# Patient Record
Sex: Male | Born: 1970 | Race: White | Hispanic: No | Marital: Married | Smoking: Current every day smoker
Health system: Southern US, Community
[De-identification: ages and names within clinical notes are randomized; demographics above are authoritative.]

## PROBLEM LIST (undated history)

## (undated) DIAGNOSIS — K589 Irritable bowel syndrome without diarrhea: Secondary | ICD-10-CM

## (undated) HISTORY — PX: CHOLECYSTECTOMY: SHX55

---

## 2008-12-20 ENCOUNTER — Ambulatory Visit: Payer: Self-pay | Admitting: Family Medicine

## 2008-12-20 DIAGNOSIS — F411 Generalized anxiety disorder: Secondary | ICD-10-CM | POA: Insufficient documentation

## 2008-12-20 DIAGNOSIS — R109 Unspecified abdominal pain: Secondary | ICD-10-CM | POA: Insufficient documentation

## 2008-12-20 DIAGNOSIS — F329 Major depressive disorder, single episode, unspecified: Secondary | ICD-10-CM

## 2008-12-20 DIAGNOSIS — K5732 Diverticulitis of large intestine without perforation or abscess without bleeding: Secondary | ICD-10-CM

## 2008-12-29 ENCOUNTER — Emergency Department (HOSPITAL_BASED_OUTPATIENT_CLINIC_OR_DEPARTMENT_OTHER): Admission: EM | Admit: 2008-12-29 | Discharge: 2008-12-29 | Payer: Self-pay | Admitting: Emergency Medicine

## 2008-12-29 ENCOUNTER — Ambulatory Visit: Payer: Self-pay | Admitting: Diagnostic Radiology

## 2008-12-29 ENCOUNTER — Ambulatory Visit: Payer: Self-pay | Admitting: Family Medicine

## 2009-01-01 ENCOUNTER — Ambulatory Visit: Payer: Self-pay | Admitting: Family Medicine

## 2009-01-01 DIAGNOSIS — R740 Nonspecific elevation of levels of transaminase and lactic acid dehydrogenase [LDH]: Secondary | ICD-10-CM

## 2009-01-01 DIAGNOSIS — K8020 Calculus of gallbladder without cholecystitis without obstruction: Secondary | ICD-10-CM | POA: Insufficient documentation

## 2009-01-02 ENCOUNTER — Encounter: Payer: Self-pay | Admitting: Family Medicine

## 2009-01-04 ENCOUNTER — Encounter: Payer: Self-pay | Admitting: Family Medicine

## 2009-01-12 ENCOUNTER — Encounter: Admission: RE | Admit: 2009-01-12 | Discharge: 2009-01-12 | Payer: Self-pay | Admitting: Unknown Physician Specialty

## 2009-03-08 ENCOUNTER — Encounter (INDEPENDENT_AMBULATORY_CARE_PROVIDER_SITE_OTHER): Payer: Self-pay | Admitting: General Surgery

## 2009-03-08 ENCOUNTER — Ambulatory Visit (HOSPITAL_COMMUNITY): Admission: RE | Admit: 2009-03-08 | Discharge: 2009-03-10 | Payer: Self-pay | Admitting: General Surgery

## 2009-03-12 ENCOUNTER — Ambulatory Visit: Payer: Self-pay | Admitting: Family Medicine

## 2009-03-12 DIAGNOSIS — L259 Unspecified contact dermatitis, unspecified cause: Secondary | ICD-10-CM | POA: Insufficient documentation

## 2009-03-21 ENCOUNTER — Emergency Department (HOSPITAL_COMMUNITY): Admission: EM | Admit: 2009-03-21 | Discharge: 2009-03-21 | Payer: Self-pay | Admitting: Emergency Medicine

## 2009-03-21 ENCOUNTER — Ambulatory Visit: Payer: Self-pay | Admitting: Family Medicine

## 2009-03-21 DIAGNOSIS — R3915 Urgency of urination: Secondary | ICD-10-CM | POA: Insufficient documentation

## 2009-03-21 DIAGNOSIS — K921 Melena: Secondary | ICD-10-CM

## 2009-03-21 LAB — CONVERTED CEMR LAB
Bilirubin Urine: NEGATIVE
Blood in Urine, dipstick: NEGATIVE
Specific Gravity, Urine: 1.015
WBC Urine, dipstick: NEGATIVE

## 2009-03-22 ENCOUNTER — Encounter: Payer: Self-pay | Admitting: Family Medicine

## 2009-04-05 ENCOUNTER — Encounter: Payer: Self-pay | Admitting: Family Medicine

## 2009-08-24 ENCOUNTER — Encounter: Payer: Self-pay | Admitting: Family Medicine

## 2009-10-02 ENCOUNTER — Encounter: Payer: Self-pay | Admitting: Family Medicine

## 2010-02-25 ENCOUNTER — Telehealth: Payer: Self-pay | Admitting: Family Medicine

## 2010-03-19 ENCOUNTER — Ambulatory Visit
Admission: RE | Admit: 2010-03-19 | Discharge: 2010-03-19 | Payer: Self-pay | Source: Home / Self Care | Attending: Family Medicine | Admitting: Family Medicine

## 2010-03-20 ENCOUNTER — Encounter: Payer: Self-pay | Admitting: Family Medicine

## 2010-03-20 LAB — CONVERTED CEMR LAB
ALT: 27 units/L (ref 0–53)
Alkaline Phosphatase: 72 units/L (ref 39–117)
Amylase: 31 units/L (ref 0–105)
BUN: 8 mg/dL (ref 6–23)
Calcium: 9.6 mg/dL (ref 8.4–10.5)
Chloride: 102 meq/L (ref 96–112)
Eosinophils Relative: 2 % (ref 0–5)
Glucose, Bld: 78 mg/dL (ref 70–99)
Lymphs Abs: 2.6 10*3/uL (ref 0.7–4.0)
MCHC: 34.3 g/dL (ref 30.0–36.0)
Monocytes Absolute: 0.6 10*3/uL (ref 0.1–1.0)
Monocytes Relative: 6 % (ref 3–12)
Platelets: 264 10*3/uL (ref 150–400)
RBC: 4.99 M/uL (ref 4.22–5.81)
Total Protein: 7.1 g/dL (ref 6.0–8.3)
WBC: 11.2 10*3/uL — ABNORMAL HIGH (ref 4.0–10.5)

## 2010-04-09 NOTE — Assessment & Plan Note (Signed)
Summary: Rash and Melena   Vital Signs:  Patient profile:   40 year old male Height:      71 inches Weight:      281 pounds Temp:     98.1 degrees F oral Pulse rate:   74 / minute BP sitting:   127 / 75  (left arm) Cuff size:   large  Vitals Entered By: [name] (March 21, 2009 2:41 PM) CC: rash still present. Having black tarry stools and leaking from rectum constantly. Feels terrible   Primary Care Provider:  [name]  CC:  rash still present. Having black tarry stools and leaking from rectum constantly. Feels terrible.  History of Present Illness: rash still present. Had cholecystectomy on 12/30 and started having rash before hospital discharge. He was given prednisone for the rash and has not responded. Completed teh steroid 4 days ago. Says the rash is really itchy still. Now notices some mild edema around the ankles bilaterally.   Having black tarry stools and leaking from rectum constantly. Feels terrible.  Also having some urinary frequency.  No hematuria.  No control over his BMs. Now has a hemorrhoid and stools look black and tarry in "globs". Doesn't matter what he eats will have a BM after 20 minutes.  Has been trying to use OTC suppositoreis for the hemorrhoid but they stimulate a BM and come right out. Feels like constantly has to urinate, started last night.  Feels like having sweats for about the past 5 days but feels like his skin is cold to touch. Not on any NSAIDs right now.   GI - Dr. Marney Setting [facility]) Gen Surgery - Dr. Derrell Lolling ([facility])  Current Medications (verified): 1)  None  Allergies (verified): 1)  ! * Opiates  Comments:  Nurse/Medical Assistant: The patient's medications and allergies were reviewed with the patient and were updated in the Medication and Allergy Lists. [name] (March 21, 2009 2:42 PM)  Physical Exam  General:  Well-developed,well-nourished,in no acute distress; alert,appropriate and cooperative throughout  examination. appears fatigued.  Skin:  diffuse erythematous fine papules on the abdomen, low back and lower extremities.   Psych:  Cognition and judgment appear intact. Alert and cooperative with normal attention span and concentration. No apparent delusions, illusions, hallucinations   Impression & Recommendations:  Problem # 1:  MELENA (ICD-578.1) Assessment New With his recent surgery I am worried about a GI bleed either post surgery or possibly from teh steroid. He has also had a persistant rash that is concerning as well as sweats though he is afebrile here. I am also concerned about possible post surgical infection.  He is really uncomfortable here in the office today.  We called [facility] and my nurse spoke with Dr. Aura Camps nurse. They recommened he go to [facility] ED immediately.  LIkely will need abdominal CT and stat labs.   30 min spent with the patient and coordinating care.    Problem # 2:  URINARY URGENCY (EAV-409.81) Assessment: New  UA is clear today so may be more related to his pelvic sxs.   Orders: UA Dipstick w/o Micro (automated)  (81003)  Problem # 3:  SKIN RASH, ALLERGIC (ICD-692.9) He clearly didn't respond  to the prednisone and his rash is still present. I am worried this may be related to an infection or some inflammatory proces in his body.   The following medications were removed from the medication list:    Prednisone 10 Mg Tabs (Prednisone) .Marland Kitchen... Take  6 tabs a day for day1, 5 tabs day 2, 4 tabs day 3, 3 tabs day 4, 2 tabs day 5, 1 tab on day 6.  Laboratory Results   Urine Tests  Date/Time Received: 03/21/2009 Date/Time Reported: 03/21/2009  Routine Urinalysis   Color: yellow Appearance: Clear Glucose: negative   (Normal Range: Negative) Bilirubin: negative   (Normal Range: Negative) Ketone: negative   (Normal Range: Negative) Spec. Gravity: 1.015   (Normal Range: 1.003-1.035) Blood: negative   (Normal Range: Negative) pH: 6.0   (Normal Range: 5.0-8.0) Protein:  negative   (Normal Range: Negative) Urobilinogen: 0.2   (Normal Range: 0-1) Nitrite: negative   (Normal Range: Negative) Leukocyte Esterace: negative   (Normal Range: Negative)       Prevention & Chronic Care Immunizations   Influenza vaccine: given  (03/09/2009)   Influenza vaccine due: 03/09/2010    Tetanus booster: 03/09/2009: given   Tetanus booster due: 03/10/2019    Pneumococcal vaccine: Not documented  Other Screening   Smoking status: Not documented  Lipids   Total Cholesterol: Not documented   LDL: Not documented   LDL Direct: Not documented   HDL: Not documented   Triglycerides: Not documented

## 2010-04-09 NOTE — Assessment & Plan Note (Signed)
Summary: Rash   Vital Signs:  Patient profile:   40 year old male Height:      71 inches Weight:      277 pounds Pulse rate:   77 / minute BP sitting:   113 / 70  (left arm) Cuff size:   large  Vitals Entered By: [name] (March 12, 2009 2:40 PM) CC: rash on legs, arms, back stomach had since discharged from hospital from gallbladder surgery- itches. Last Percocet was 3 days ago- using Benedryl   Primary Care Provider:  [name]  CC:  rash on legs, arms, and back stomach had since discharged from hospital from gallbladder surgery- itches. Last Percocet was 3 days ago- using Benedryl.  History of Present Illness: rash on legs, arms, back stomach had since discharged from hospital from gallbladder surgery- itches. Last Percocet was 3 days ago- using Benedryl.  Rash started while in the hospitalized.  Was told it may be reaction to the compression  hose.  Benadryl does help stop the itching.  Also has stopped his percocet.  Was on morphine in the hospital. NOt d/c home on any new medications. Rash started on his legs. Had flu and PNA shot on the 30th and rash started next day. no SOB.  REviewd post op note.   Current Medications (verified): 1)  None  Allergies (verified): 1)  ! * Opiates  Comments:  Nurse/Medical Assistant: The patient's medications and allergies were reviewed with the patient and were updated in the Medication and Allergy Lists. [name] (March 12, 2009 2:41 PM)  Physical Exam  General:  Well-developed,well-nourished,in no acute distress; alert,appropriate and cooperative throughout examination Abdomen:  Sugrical scars healing well. No drainage or erythema.  Skin:  Erythematous fine papules on his LE, abdomen and back.   Psych:  Cognition and judgment appear intact. Alert and cooperative with normal attention span and concentration. No apparent delusions, illusions, hallucinations   Impression & Recommendations:  Problem # 1:  SKIN  RASH, ALLERGIC (ICD-692.9)  Will treat with oral steroids. Warned of potential Side effects. Likely an allergic reaction to medication or anesthesia. He is no longer being exposed so hopefully will improve in one week.  If not better in one week or comes back then please let me know.   His updated medication list for this problem includes:    Prednisone 10 Mg Tabs (Prednisone) .Marland Kitchen... Take 6 tabs a day for day1, 5 tabs day 2, 4 tabs day 3, 3 tabs day 4, 2 tabs day 5, 1 tab on day 6.  Complete Medication List: 1)  Prednisone 10 Mg Tabs (Prednisone) .... Take 6 tabs a day for day1, 5 tabs day 2, 4 tabs day 3, 3 tabs day 4, 2 tabs day 5, 1 tab on day 6. Prescriptions: PREDNISONE 10 MG TABS (PREDNISONE) Take 6 tabs a day for Day1, 5 tabs Day 2, 4 tabs Day 3, 3 tabs Day 4, 2 tabs Day 5, 1 tab on Day 6.  #210 x 0   Entered and Authorized by:   [name]   Signed by:   [name] on 03/12/2009   Method used:   Electronically to        [facility] [phone]* (retail)       [address] [place], [place]  [address]       Ph: [phone]       Fax: [phone]  RxID:   [id number]   Flu Vaccine Result Date:  03/09/2009 Flu Vaccine Result:  given Flu Vaccine Next Due:  1 yr TD Result Date:  03/09/2009 TD Result:  given TD Next Due:  10 yr

## 2010-04-09 NOTE — Letter (Signed)
Summary: [facility]   Imported By: Lanelle Bal 03/29/2009 10:59:45  _____________________________________________________________________  External Attachment:    Type:   Image     Comment:   External Document

## 2010-04-09 NOTE — Letter (Signed)
Summary: [facility]   Imported By: Lanelle Bal 09/06/2009 08:24:37  _____________________________________________________________________  External Attachment:    Type:   Image     Comment:   External Document

## 2010-04-09 NOTE — Letter (Signed)
Summary: [facility]  [facility]   Imported By: Lanelle Bal 04/20/2009 11:33:57  _____________________________________________________________________  External Attachment:    Type:   Image     Comment:   External Document

## 2010-04-11 NOTE — Assessment & Plan Note (Signed)
Summary: Abdominal Pain   Vital Signs:  Patient profile:   40 year old male Height:      71 inches Weight:      267.50 pounds BMI:     37.44 O2 Sat:      98 % on Room air Temp:     98.2 degrees F oral Pulse rate:   72 / minute Pulse rhythm:   regular Resp:     18 per minute BP sitting:   117 / 78  (right arm) Cuff size:   large  Vitals Entered By: Mervin Kung CMA Duncan Dull) (March 19, 2010 2:47 PM)  O2 Flow:  Room air CC: Pt state he is having abdominal pain x 1 1/2 years. Is Patient Diabetic? No Comments Pt states he is smoking pot for the pain. Mervin Kung CMA Duncan Dull)  March 19, 2010 2:53 PM    Primary Care Provider:  [name]  CC:  Pt state he is having abdominal pain x 1 1/2 years..  History of Present Illness: Say the dicyclomine makes his bladder numb and affects urination. Also feels the medication is not really helping him. Abodminal pain is moslty on the left and goes around the right side near his incision.  Still feels very bloated.  But not alot of gas.  Occ nauseated. oNly eats once a day. Says stomach starts hurting soon after he eats. Has diarrhea about 1 hour after he eats. Feel he is getting much worse. Occ will have 2-3 good days but now feels worse. Says his stools are mostly mucously but hasn't seen blood in awhile.   Allergies: 1)  ! * Opiates  Family History: Reviewed history from 12/20/2008 and no changes required. Mother, Graves disease Father, Heart disease  Social History: Reviewed history from 12/20/2008 and no changes required. 1 ppd smoker, 26 yrs ETOH-yes No DRugs Heating and Air  Physical Exam  General:  Well-developed,well-nourished,in no acute distress; alert,appropriate and cooperative throughout examination Lungs:  Normal respiratory effort, chest expands symmetrically. Lungs are clear to auscultation, no crackles or wheezes. Heart:  Normal rate and regular rhythm. S1 and S2 normal without gallop, murmur,  click, rub or other extra sounds. Abdomen:  Very tender in the LUQ.  Normal BS. No masses. No organomegaly.  Skin:  no rashes.   Psych:  Cognition and judgment appear intact. Alert and cooperative with normal attention span and concentration. No apparent delusions, illusions, hallucinations   Impression & Recommendations:  Problem # 1:  ABDOMINAL PAIN (ICD-789.00) Unclear etiology. He has had extensive w/u in the past and has appt in 6 weeks with GI in [facility] him samples of dexilant to try. He can stop the dicyclomine if not really helping.  Will rule out pancreatitis or acute infection since he feels his sxs are worse.  Orders: T-Comprehensive Metabolic Panel [id number]) T-CBC w/Diff [id number]) T-Amylase [id number]) T-Lipase [id number]) T-TSH [id number])  Complete Medication List: 1)  Dicyclomine Hcl 20 Mg Tabs (Dicyclomine hcl) .... Take 1 tablet by mouth up to four times a day as needed for stomach pain.  Patient Instructions: 1)  Try the dexilant once a day about 15 min before your meal.   2)  Keep GI appointment in February   Orders Added: 1)  T-Comprehensive Metabolic Panel [[id number]] 2)  T-CBC w/Diff [[id number]] 3)  T-Amylase [[id number]] 4)  T-Lipase [[id number]] 5)  T-TSH [[id number]] 6)  Est. Patient Level IV [38756]    Current Allergies (reviewed today): ! * OPIATES

## 2010-04-11 NOTE — Progress Notes (Signed)
Summary: referral to GI and pain no better  Phone Note Call from Patient Call back at [phone]   Caller: Patient Call For: [name] Summary of Call: Would like a referral to Dr. Karena Addison located at [facility] GI- GI he is seeing he is not satisfied with what he is telling them. Pt states alot of abdominal pain and bloating and wants to know what else he can take for the pain- not taking anything for the pain Initial call taken by: [name] LPN,  February 25, 2010 11:00 AM  Follow-up for Phone Call        I would avoid any dairy or high fat foods, scine no longer has his GB.  We can make new referral. We can try dicyclomine for discomfort until get in with GI.  Follow-up by: [name],  February 25, 2010 11:20 AM  Additional Follow-up for Phone Call Additional follow up Details #1::        Wife notified of above and that med was sent to [facility]. Wife requested that med be sent to [facility] so resent to them. KJ LPN Additional Follow-up by: [name] LPN,  February 25, 2010 11:28 AM    New/Updated Medications: DICYCLOMINE HCL 20 MG TABS (DICYCLOMINE HCL) Take 1 tablet by mouth up to four times a day as needed for stomach pain. Prescriptions: DICYCLOMINE HCL 20 MG TABS (DICYCLOMINE HCL) Take 1 tablet by mouth up to four times a day as needed for stomach pain.  #90 x 0   Entered by:   [name] LPN   Authorized by:   [name]   Signed by:   [name] LPN on 20/25/4270   Method used:   Electronically to        [facility]* (retail)       [address]       [place], [place]  [address]       Ph: [phone]       Fax: [phone]   RxID:   [id number] DICYCLOMINE HCL 20 MG TABS (DICYCLOMINE HCL) Take 1 tablet by mouth up to four times a day as needed for stomach pain.  #90 x 0   Entered and Authorized by:   [name]   Signed by:   [name] on 02/25/2010   Method used:    Electronically to        [facility] [phone]* (retail)       [address] [place], [place]  [address]       Ph: [phone]       Fax: [phone]   RxID:   [id number]   Appended Document: referral to GI and pain no better

## 2010-05-06 ENCOUNTER — Encounter: Payer: Self-pay | Admitting: Family Medicine

## 2010-05-26 LAB — CBC
HCT: 43.9 % (ref 39.0–52.0)
MCV: 92.6 fL (ref 78.0–100.0)

## 2010-05-26 LAB — DIFFERENTIAL
Lymphocytes Relative: 31 % (ref 12–46)
Lymphs Abs: 2.6 10*3/uL (ref 0.7–4.0)
Monocytes Relative: 7 % (ref 3–12)
Neutro Abs: 4.8 10*3/uL (ref 1.7–7.7)
Neutrophils Relative %: 56 % (ref 43–77)

## 2010-05-26 LAB — COMPREHENSIVE METABOLIC PANEL
AST: 30 U/L (ref 0–37)
Albumin: 4.1 g/dL (ref 3.5–5.2)
BUN: 10 mg/dL (ref 6–23)
Calcium: 9.4 mg/dL (ref 8.4–10.5)
GFR calc non Af Amer: >60 mL/min (ref >60)
Potassium: 4.3 mEq/L (ref 3.5–5.1)
Sodium: 139 mEq/L (ref 135–145)
Total Bilirubin: 0.6 mg/dL (ref 0.3–1.2)
Total Protein: 7 g/dL (ref 6.0–8.3)

## 2010-05-26 LAB — URINE CULTURE: Culture: NO GROWTH

## 2010-05-26 LAB — URINALYSIS, ROUTINE W REFLEX MICROSCOPIC: Urobilinogen, UA: 0.2 mg/dL (ref 0.0–1.0)

## 2010-05-26 LAB — LIPASE, BLOOD: Lipase: 25 U/L (ref 11–59)

## 2010-05-28 NOTE — Consult Note (Signed)
[facility]   Imported By: Maryln Gottron 05/21/2010 10:03:06  _____________________________________________________________________  External Attachment:    Type:   Image     Comment:   External Document

## 2010-06-10 LAB — COMPREHENSIVE METABOLIC PANEL
ALT: 99 U/L — ABNORMAL HIGH (ref 0–53)
AST: 71 U/L — ABNORMAL HIGH (ref 0–37)
Albumin: 4 g/dL (ref 3.5–5.2)
Alkaline Phosphatase: 69 U/L (ref 39–117)
BUN: 13 mg/dL (ref 6–23)
CO2: 27 mEq/L (ref 19–32)
Calcium: 9.4 mg/dL (ref 8.4–10.5)
Chloride: 105 mEq/L (ref 96–112)
Creatinine, Ser: 0.9 mg/dL (ref 0.4–1.5)
GFR calc Af Amer: >60 mL/min (ref >60)
GFR calc non Af Amer: >60 mL/min (ref >60)
Glucose, Bld: 106 mg/dL — ABNORMAL HIGH (ref 70–99)
Potassium: 4.4 mEq/L (ref 3.5–5.1)
Sodium: 138 mEq/L (ref 135–145)
Total Bilirubin: 0.3 mg/dL (ref 0.3–1.2)
Total Protein: 7.1 g/dL (ref 6.0–8.3)

## 2010-06-10 LAB — DIFFERENTIAL
Basophils Absolute: 0 10*3/uL (ref 0.0–0.1)
Basophils Relative: 0 % (ref 0–1)
Eosinophils Relative: 6 % — ABNORMAL HIGH (ref 0–5)
Lymphocytes Relative: 30 % (ref 12–46)
Neutro Abs: 4 10*3/uL (ref 1.7–7.7)

## 2010-06-10 LAB — CBC
MCHC: 34.2 g/dL (ref 30.0–36.0)
MCV: 93.1 fL (ref 78.0–100.0)
Platelets: 264 10*3/uL (ref 150–400)
WBC: 7.2 10*3/uL (ref 4.0–10.5)

## 2010-06-10 LAB — URINALYSIS, ROUTINE W REFLEX MICROSCOPIC
Hgb urine dipstick: NEGATIVE
Ketones, ur: NEGATIVE mg/dL
Nitrite: NEGATIVE
Protein, ur: NEGATIVE mg/dL

## 2010-06-13 LAB — DIFFERENTIAL
Basophils Absolute: 0.1 10*3/uL (ref 0.0–0.1)
Eosinophils Relative: 3 % (ref 0–5)
Lymphocytes Relative: 34 % (ref 12–46)
Lymphs Abs: 2.9 10*3/uL (ref 0.7–4.0)
Monocytes Relative: 7 % (ref 3–12)
Neutrophils Relative %: 55 % (ref 43–77)

## 2010-06-13 LAB — CBC
Hemoglobin: 15.8 g/dL (ref 13.0–17.0)
MCHC: 35.1 g/dL (ref 30.0–36.0)
MCV: 92.1 fL (ref 78.0–100.0)
Platelets: 247 10*3/uL (ref 150–400)
RDW: 12.1 % (ref 11.5–15.5)

## 2010-06-13 LAB — URINALYSIS, ROUTINE W REFLEX MICROSCOPIC: Specific Gravity, Urine: 1.017 (ref 1.005–1.030)

## 2010-06-13 LAB — COMPREHENSIVE METABOLIC PANEL
Alkaline Phosphatase: 73 U/L (ref 39–117)
Calcium: 9.7 mg/dL (ref 8.4–10.5)
Creatinine, Ser: 1 mg/dL (ref 0.4–1.5)
GFR calc Af Amer: >60 mL/min (ref >60)
Glucose, Bld: 83 mg/dL (ref 70–99)
Sodium: 141 mEq/L (ref 135–145)
Total Protein: 7.2 g/dL (ref 6.0–8.3)

## 2010-06-13 LAB — LIPASE, BLOOD: Lipase: 108 U/L (ref 23–300)

## 2011-03-27 ENCOUNTER — Other Ambulatory Visit: Payer: Self-pay | Admitting: Family Medicine

## 2011-03-27 MED ORDER — TAMSULOSIN HCL 0.4 MG PO CAPS: 0.4000 mg | ORAL_CAPSULE | Freq: Every day | ORAL | Status: DC

## 2012-12-15 ENCOUNTER — Encounter: Payer: Self-pay | Admitting: Emergency Medicine

## 2012-12-15 ENCOUNTER — Emergency Department
Admission: EM | Admit: 2012-12-15 | Discharge: 2012-12-15 | Disposition: A | Discharge status: Home / Self Care | Payer: BC Managed Care – PPO | Source: Home / Self Care

## 2012-12-15 DIAGNOSIS — R112 Nausea with vomiting, unspecified: Secondary | ICD-10-CM

## 2012-12-15 DIAGNOSIS — R509 Fever, unspecified: Secondary | ICD-10-CM

## 2012-12-15 HISTORY — DX: Irritable bowel syndrome, unspecified: K58.9

## 2012-12-15 LAB — POCT CBC W AUTO DIFF (K'VILLE URGENT CARE)

## 2012-12-15 LAB — POCT URINALYSIS DIP (MANUAL ENTRY)
Bilirubin, UA: NEGATIVE
Glucose, UA: NEGATIVE
Ketones, POC UA: NEGATIVE
Leukocytes, UA: NEGATIVE
Nitrite, UA: NEGATIVE
Protein Ur, POC: NEGATIVE
Spec Grav, UA: 1.01 (ref 1.005–1.03)

## 2012-12-15 LAB — POCT INFLUENZA A/B
Influenza A, POC: NEGATIVE
Influenza B, POC: NEGATIVE

## 2012-12-15 NOTE — ED Notes (Signed)
Ronald Hester c/o fever, chills, vomiting, body aches, HA x yesterday. Diarrhea started today. Last dose of IBF 3 hours ago. No flu vaccine this season.

## 2012-12-15 NOTE — ED Provider Notes (Signed)
CSN: [id number]     Arrival date & time 12/15/12  1341 History   None    Chief Complaint  Patient presents with  . Emesis  . Generalized Body Aches      HPI Comments: Yesterday evening about 6PM patient developed myalgias, fatigue, chills/sweats, and headache.  He had several episodes of vomiting without nausea.  He has had several episodes of diarrhea but notes that diarrhea is normal for him because he has IBS.  He has also had stomach pain, which he states is also usual for him.  No hematochezia.  He noted increased urine frequency without dysuria.  No respiratory symptoms.  He complains of some vague lower back discomfort. He has not travelled overseas recently, and has not been exposed to anyone recently who has travelled overseas.  The history is provided by the patient.    Past Medical History  Diagnosis Date  . IBS (irritable bowel syndrome)    Past Surgical History  Procedure Laterality Date  . Cholecystectomy     History reviewed. No pertinent family history. History  Substance Use Topics  . Smoking status: Current Every Day Smoker -- 1.00 packs/day for 30 years    Types: Cigarettes  . Smokeless tobacco: Current User     Comment: vape  . Alcohol Use: No    Review of Systems No sore throat No cough No pleuritic pain No wheezing No nasal congestion No post-nasal drainage No sinus pain/pressure No itchy/red eyes No earache No hemoptysis No SOB + fever, + chills No nausea + vomiting + abdominal pain + low back ache + diarrhea; no hematochezia Increased urine frequency without dysuria No skin rashes + fatigue + myalgias + headache    Allergies  Codeine; Fructose; and Lactose intolerance (gi)  Home Medications  No current outpatient prescriptions on file. BP 107/71  Pulse 71  Temp(Src) 98.1 F (36.7 C) (Oral)  Resp 16  Ht 5\' 11"  (1.803 m)  Wt 239 lb (108.41 kg)  BMI 33.35 kg/m2  SpO2 98% Physical Exam Nursing notes and Vital Signs  reviewed. Appearance:  Patient appears stated age, and in no acute distress.  Patient is obese (BMI 33.4) Eyes:  Pupils are equal, round, and reactive to light and accomodation.  Extraocular movement is intact.  Conjunctivae are not inflamed  Ears:  Canals normal.  Tympanic membranes normal.  Nose:  Mildly congested turbinates.  No sinus tenderness.   Mouth:  No lesions; moist mucous membranes   Pharynx:  Normal Neck:  Supple.   Tender shotty posterior nodes are palpated bilaterally  Lungs:  Clear to auscultation.  Breath sounds are equal.  Heart:  Regular rate and rhythm without murmurs, rubs, or gallops.  Abdomen:   Tenderness over spleen without hepatosplenomegaly. Tenderness left lower quadrant without masses..  Bowel sounds are present.  No CVA or flank tenderness.  Extremities:  No edema.  No calf tenderness Skin:  No rash present.   Back:  No tenderness to palpation. ED Course  Procedures  none    Labs Reviewed  EPSTEIN-BARR VIRUS VCA, IGM  EPSTEIN-BARR VIRUS VCA, IGG  EPSTEIN-BARR VIRUS NUCLEAR ANTIGEN ANTIBODY, IGG  EPSTEIN-BARR VIRUS EARLY D ANTIGEN ANTIBODY, IGG  POCT URINALYSIS DIP (MANUAL ENTRY) Negative  POCT INFLUENZA A/B negative  POCT CBC W AUTO DIFF ([facility])   WBC 6.7; LY 40.4; MO 12.7; GR 46.9; Hgb 14.6; Platelets 225        MDM   1. Nausea with vomiting   2. Fever,  unspecified; ?mono (note increased monocytes on diff:  12.7)    Check EBV titers Rest.  Continue increased fluid intake.  May take Ibuprofen 200mg , 4 tabs every 8 hours with food for headache, body aches, etc. Followup with Family Doctor if not improved in one week.  If symptoms become significantly worse during the night or over the weekend, proceed to the local emergency room.     Lattie Haw, MD 12/15/12 [phone]

## 2012-12-16 LAB — EPSTEIN-BARR VIRUS EARLY D ANTIGEN ANTIBODY, IGG: EBV EA IgG: 5.2 U/mL (ref <9.0)

## 2012-12-16 LAB — EPSTEIN-BARR VIRUS NUCLEAR ANTIGEN ANTIBODY, IGG: EBV NA IgG: 328 U/mL — ABNORMAL HIGH (ref <18.0)

## 2012-12-16 LAB — EPSTEIN-BARR VIRUS VCA, IGM: EBV VCA IgM: 23.2 U/mL (ref <36.0)

## 2012-12-16 LAB — EPSTEIN-BARR VIRUS VCA, IGG: EBV VCA IgG: 495 U/mL — ABNORMAL HIGH (ref <18.0)

## 2012-12-17 ENCOUNTER — Telehealth: Payer: Self-pay | Admitting: *Deleted

## 2013-05-16 ENCOUNTER — Encounter: Payer: Self-pay | Admitting: Family Medicine

## 2013-05-16 ENCOUNTER — Ambulatory Visit (INDEPENDENT_AMBULATORY_CARE_PROVIDER_SITE_OTHER): Payer: BC Managed Care – PPO | Admitting: Family Medicine

## 2013-05-16 VITALS — BP 104/60 | HR 69 | Temp 97.0°F | Ht 71.0 in | Wt 221.0 lb

## 2013-05-16 DIAGNOSIS — K589 Irritable bowel syndrome without diarrhea: Secondary | ICD-10-CM | POA: Insufficient documentation

## 2013-05-16 DIAGNOSIS — F411 Generalized anxiety disorder: Secondary | ICD-10-CM

## 2013-05-16 MED ORDER — HYOSCYAMINE SULFATE ER 0.375 MG PO TB12
0.3750 mg | ORAL_TABLET | Freq: Two times a day (BID) | ORAL | Status: DC
Start: 2013-05-16 — End: 2013-07-28

## 2013-05-16 MED ORDER — AMITRIPTYLINE HCL 50 MG PO TABS: 25.0000 mg | ORAL_TABLET | Freq: Every day | ORAL | Status: DC

## 2013-05-16 NOTE — Progress Notes (Addendum)
Subjective:    Patient ID: Ronald Hester, male    DOB: 04/21/1970, 43 y.o.   MRN: [id number]  HPI Hx of IBS.  Says used to be on anti-depressants and that helped his IBS but didn't like how he felt on it. Lactose seems to be a trigger for him as well.  Fructose as well.  Does have a hx of Anxiety.  Also has a IGA deficiency.  His spasms improved greatly after cutting out soda.  Thinks ate something with milk in it last night.   Gets a lot of pelvic pain with his IBS and has had a Urology workup in the past. Negative for celiac. He does mediate. His cramping is so severe at times that doubles him over. His wife wants to know at what point patient taken to the emergency room. He has occasionally vomited with this pain but this is not common. He is working with Dr. Loreta AveMann, gastroneurology. The she felt more comfortable with the PCP prescribing anxiolytic-type medications.   Review of Systems  BP 104/60  Pulse 69  Temp(Src) 97 F (36.1 C)  Ht 5\' 11"  (1.803 m)  Wt 221 lb (100.245 kg)  BMI 30.84 kg/m2  SpO2 98%    Allergies  Allergen Reactions  . Codeine   . Fructose   . Lactose Intolerance (Gi)     Past Medical History  Diagnosis Date  . IBS (irritable bowel syndrome)     Past Surgical History  Procedure Laterality Date  . Cholecystectomy      History   Social History  . Marital Status: Married    Spouse Name: N/A    Number of Children: N/A  . Years of Education: N/A   Occupational History  . Not on file.   Social History Main Topics  . Smoking status: Current Every Day Smoker -- 1.00 packs/day for 30 years    Types: Cigarettes  . Smokeless tobacco: Current User     Comment: vape  . Alcohol Use: No  . Drug Use: Yes    Special: Marijuana  . Sexual Activity: Not on file   Other Topics Concern  . Not on file   Social History Narrative  . No narrative on file    No family history on file.  Outpatient Encounter Prescriptions as of 05/16/2013  Medication Sig   . Tamsulosin HCl (FLOMAX) 0.4 MG CAPS Take 1 capsule (0.4 mg total) by mouth daily.  Marland Kitchen. amitriptyline (ELAVIL) 50 MG tablet Take 0.5-1 tablets (25-50 mg total) by mouth at bedtime.  . hyoscyamine (LEVBID) 0.375 MG 12 hr tablet Take 1 tablet (0.375 mg total) by mouth 2 (two) times daily.          Objective:   Physical Exam  Constitutional: He is oriented to person, place, and time. He appears well-developed and well-nourished.  HENT:  Head: Normocephalic and atraumatic.  Cardiovascular: Normal rate, regular rhythm and normal heart sounds.   Pulmonary/Chest: Effort normal and breath sounds normal.  Abdominal: Soft. Bowel sounds are normal. He exhibits no distension and no mass. There is tenderness. There is no rebound and no guarding.  Tender to palpation suprapubically and especially in the left lower quadrant  Neurological: He is alert and oriented to person, place, and time.  Skin: Skin is warm and dry.  Psychiatric: He has a normal mood and affect. His behavior is normal.          Assessment & Plan:  IBS - we discussed trying a TCA  which has been shown in studies to be helpful for patients with IBS. Recommend amitriptyline at bedtime.  Discussed potential side effects of the medication. Start with half a tab and increase as tolerated. I would like to see him back in 4-6 weeks to make sure he is tolerating it well and to adjust his dose as needed. We could certainly consider other options as well if this is not helpful. Also recommend a trial of hyoscyamine, Levbid twice a day to see if this helps with the spasms of the small bowel. He had tried it previously but at the time was still eating a lot of foods that were major triggers for him. Now he is on a better diet and bowel regimen he would like to retry it. I think this is absolutely reasonable and hopefully will be helpful for him. I do think we need to get him back to quality of life so he can get back to work. He is currently  unemployed.  Anxiety-we discussed that this is directly a trigger for his irritable bowel syndrome. Consider SSRI if not respondong to the amitriptyline. I think these issues are definitely directly related to really focus on the IBS right now and then address the anxiety is not responding to treatment for the IBS.  Time spent 25 minutes, greater than 50% spent counseling about irritable bowel syndrome and generalized anxiety disorder.

## 2013-05-23 ENCOUNTER — Telehealth: Payer: Self-pay | Admitting: *Deleted

## 2013-05-23 NOTE — Telephone Encounter (Signed)
The dose doesn't come any higher.  Can take again in 8 hours of needed. Don't have to wait a full twelve hours. I'm glad it is helping.

## 2013-05-23 NOTE — Telephone Encounter (Signed)
Pt called and stated the hyoscyamine is working however it wears off after about 8 hours. He would like to know if the mg strength could be increased. Please advise.Loralee PacasBarkley, Lumi Winslett [place]

## 2013-05-24 NOTE — Telephone Encounter (Signed)
lvm informing pt of recommendations. .Ronald Hester  

## 2013-07-28 ENCOUNTER — Encounter: Payer: Self-pay | Admitting: Family Medicine

## 2013-07-28 ENCOUNTER — Ambulatory Visit (INDEPENDENT_AMBULATORY_CARE_PROVIDER_SITE_OTHER): Payer: BC Managed Care – PPO | Admitting: Family Medicine

## 2013-07-28 VITALS — BP 113/70 | HR 79 | Wt 218.0 lb

## 2013-07-28 DIAGNOSIS — F17200 Nicotine dependence, unspecified, uncomplicated: Secondary | ICD-10-CM

## 2013-07-28 DIAGNOSIS — R112 Nausea with vomiting, unspecified: Secondary | ICD-10-CM

## 2013-07-28 DIAGNOSIS — R0789 Other chest pain: Secondary | ICD-10-CM

## 2013-07-28 DIAGNOSIS — D802 Selective deficiency of immunoglobulin A [IgA]: Secondary | ICD-10-CM | POA: Insufficient documentation

## 2013-07-28 MED ORDER — AMITRIPTYLINE HCL 50 MG PO TABS: 25.0000 mg | ORAL_TABLET | Freq: Every day | ORAL | Status: AC

## 2013-07-28 MED ORDER — HYOSCYAMINE SULFATE ER 0.375 MG PO TB12: 0.3750 mg | ORAL_TABLET | Freq: Two times a day (BID) | ORAL | Status: AC

## 2013-07-28 MED ORDER — OMEPRAZOLE 40 MG PO CPDR: 40.0000 mg | DELAYED_RELEASE_CAPSULE | Freq: Every day | ORAL | Status: DC

## 2013-07-28 NOTE — Progress Notes (Signed)
Subjective:    Patient ID: Ronald Hester, male    DOB: [birth year], 43 y.o.   MRN: [id number]  HPI 43 year old white male Last 2 days has woken up at 4AM and vomited. Says doesn't see blood but tastes blood.  Hx of IBS.  No recent dietary changes. History of cholescytectomy. Taking prilosec as needed.  No GERD sxs.  Says away from preservatives or sugars.  Stay away from milk products.  He admits his stress levels have been up recently. No blood in the stool. He does have history of irritable bowel syndrome. He was last seen in 2012 by gastroenterology at [facility] and did have an EGD performed.  He's also been having some left-sided chest pain. It started about 2 or 3 weeks ago. Then coming and going. It seems to happen more often with activity and is better at rest. At first he thought was more musculoskeletal but he says it's not tender when he presses on it. He then went to see a massage therapist and it didn't seem to help. He feels like it's deep in the tissue. And radiates through his chest to his back. No nausea or vomiting with the chest pain. He says it seems to get better when he rests. Known triggers or injury. He is a one pack-a-day smoker.   Review of Systems  BP 113/70  Pulse 79  Wt 218 lb (98.884 kg)    Allergies  Allergen Reactions  . Codeine   . Fructose   . Lactose Intolerance (Gi)     Past Medical History  Diagnosis Date  . IBS (irritable bowel syndrome)     Past Surgical History  Procedure Laterality Date  . Cholecystectomy      History   Social History  . Marital Status: Married    Spouse Name: N/A    Number of Children: N/A  . Years of Education: N/A   Occupational History  . Not on file.   Social History Main Topics  . Smoking status: Current Every Day Smoker -- 1.00 packs/day for 30 years    Types: Cigarettes  . Smokeless tobacco: Current User     Comment: vape  . Alcohol Use: No  . Drug Use: Yes    Special: Marijuana  . Sexual  Activity: Not on file   Other Topics Concern  . Not on file   Social History Narrative  . No narrative on file    No family history on file.  Outpatient Encounter Prescriptions as of 07/28/2013  Medication Sig  . amitriptyline (ELAVIL) 50 MG tablet Take 0.5-1 tablets (25-50 mg total) by mouth at bedtime.  . hyoscyamine (LEVBID) 0.375 MG 12 hr tablet Take 1 tablet (0.375 mg total) by mouth 2 (two) times daily.  . [DISCONTINUED] amitriptyline (ELAVIL) 50 MG tablet Take 0.5-1 tablets (25-50 mg total) by mouth at bedtime.  . [DISCONTINUED] hyoscyamine (LEVBID) 0.375 MG 12 hr tablet Take 1 tablet (0.375 mg total) by mouth 2 (two) times daily.  Marland Kitchen. omeprazole (PRILOSEC) 40 MG capsule Take 1 capsule (40 mg total) by mouth daily.  . [DISCONTINUED] Tamsulosin HCl (FLOMAX) 0.4 MG CAPS Take 1 capsule (0.4 mg total) by mouth daily.          Objective:   Physical Exam  Constitutional: He is oriented to person, place, and time. He appears well-developed and well-nourished.  HENT:  Head: Normocephalic and atraumatic.  Right Ear: External ear normal.  Left Ear: External ear normal.  Nose: Nose normal.  Mouth/Throat: Oropharynx is clear and moist.  TMs and canals are clear.   Eyes: Conjunctivae and EOM are normal. Pupils are equal, round, and reactive to light.  Neck: Neck supple. No thyromegaly present.  Cardiovascular: Normal rate, regular rhythm and normal heart sounds.   Pulmonary/Chest: Effort normal. No respiratory distress. He has no wheezes. He has rales. He exhibits no tenderness.  Abdominal: Soft. Bowel sounds are normal. He exhibits no distension and no mass. There is no tenderness. There is no rebound and no guarding.  Lymphadenopathy:    He has no cervical adenopathy.  Neurological: He is alert and oriented to person, place, and time.  Skin: Skin is warm and dry.  Psychiatric: He has a normal mood and affect. His behavior is normal.          Assessment & Plan:   Vomiting/nausea-most likely secondary to gastritis. We'll start him back on omeprazole 40 mg twice a day for week or 2. This is controlling his symptoms then can decrease back down to once a day. We'll check a CBC to evaluate for anemia and check the white blood cell count to make sure there is no sign of infection. 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 He also has a history of elevated transaminases so will recheck liver enzymes today as well. Also consider could be related to anxiety as a stress levels have been higher recently.  Atypical chest pain-EKG performed today.  Rate of 66 bpm, NSR, no acute changes.  Inverted T wave in lead 3.  We'll also evaluate for pancreatitis since his discomfort is on the left side of the lower chest.  Tobacco abuse-encourage cessation. Also increases risk of gastritis.

## 2013-07-28 NOTE — Patient Instructions (Addendum)
Diet for Gastroesophageal Reflux Disease, Adult Reflux is when stomach acid flows up into the esophagus. The esophagus becomes irritated and sore (inflammation). When reflux happens often and is severe, it is called gastroesophageal reflux disease (GERD). What you eat can help ease any discomfort caused by GERD. FOODS OR DRINKS TO AVOID OR LIMIT  Coffee and black tea, with or without caffeine.  Bubbly (carbonated) drinks with caffeine or energy drinks.  Strong spices, such as pepper, cayenne pepper, curry, or chili powder.  Peppermint or spearmint.  Chocolate.  High-fat foods, such as meats, fried food, oils, butter, or nuts.  Fruits and vegetables that cause discomfort. This includes citrus fruits and tomatoes.  Alcohol. If a certain food or drink irritates your GERD, avoid eating or drinking it. THINGS THAT MAY HELP GERD INCLUDE:  Eat meals slowly.  Eat 5 to 6 small meals a day, not 3 large meals.  Do not eat food for a certain amount of time if it causes discomfort.  Wait 3 hours after eating before lying down.  Keep the head of your bed raised 6 to 9 inches (15 23 centimeters). Put a foam wedge or blocks under the legs of the bed.  Stay active. Weight loss, if needed, may help ease your discomfort.  Wear loose-fitting clothing.  Do not smoke or chew tobacco. Document Released: 08/26/2011 Document Reviewed: 08/26/2011 [facility] Patient Information 2014 [place].   Smoking Cessation Quitting smoking is important to your health and has many advantages. However, it is not always easy to quit since nicotine is a very addictive drug. Often times, people try 3 times or more before being able to quit. This document explains the best ways for you to prepare to quit smoking. Quitting takes hard work and a lot of effort, but you can do it. ADVANTAGES OF QUITTING SMOKING  You will live longer, feel better, and live better.  Your body will feel the impact of quitting  smoking almost immediately.  Within 20 minutes, blood pressure decreases. Your pulse returns to its normal level.  After 8 hours, carbon monoxide levels in the blood return to normal. Your oxygen level increases.  After 24 hours, the chance of having a heart attack starts to decrease. Your breath, hair, and body stop smelling like smoke.  After 48 hours, damaged nerve endings begin to recover. Your sense of taste and smell improve.  After 72 hours, the body is virtually free of nicotine. Your bronchial tubes relax and breathing becomes easier.  After 2 to 12 weeks, lungs can hold more air. Exercise becomes easier and circulation improves.  The risk of having a heart attack, stroke, cancer, or lung disease is greatly reduced.  After 1 year, the risk of coronary heart disease is cut in half.  After 5 years, the risk of stroke falls to the same as a nonsmoker.  After 10 years, the risk of lung cancer is cut in half and the risk of other cancers decreases significantly.  After 15 years, the risk of coronary heart disease drops, usually to the level of a nonsmoker.  If you are pregnant, quitting smoking will improve your chances of having a healthy baby.  The people you live with, especially any children, will be healthier.  You will have extra money to spend on things other than cigarettes. QUESTIONS TO THINK ABOUT BEFORE ATTEMPTING TO QUIT You may want to talk about your answers with your caregiver.  Why do you want to quit?  If you tried  to quit in the past, what helped and what did not?  What will be the most difficult situations for you after you quit? How will you plan to handle them?  Who can help you through the tough times? Your family? Friends? A caregiver?  What pleasures do you get from smoking? What ways can you still get pleasure if you quit? Here are some questions to ask your caregiver:  How can you help me to be successful at quitting?  What medicine do you  think would be best for me and how should I take it?  What should I do if I need more help?  What is smoking withdrawal like? How can I get information on withdrawal? GET READY  Set a quit date.  Change your environment by getting rid of all cigarettes, ashtrays, matches, and lighters in your home, car, or work. Do not let people smoke in your home.  Review your past attempts to quit. Think about what worked and what did not. GET SUPPORT AND ENCOURAGEMENT You have a better chance of being successful if you have help. You can get support in many ways.  Tell your family, friends, and co-workers that you are going to quit and need their support. Ask them not to smoke around you.  Get individual, group, or telephone counseling and support. Programs are available at [facility]. Call your local health department for information about programs in your area.  Spiritual beliefs and practices may help some smokers quit.  Download a "quit meter" on your computer to keep track of quit statistics, such as how long you have gone without smoking, cigarettes not smoked, and money saved.  Get a self-help book about quitting smoking and staying off of tobacco. LEARN NEW SKILLS AND BEHAVIORS  Distract yourself from urges to smoke. Talk to someone, go for a walk, or occupy your time with a task.  Change your normal routine. Take a different route to work. Drink tea instead of coffee. Eat breakfast in a different place.  Reduce your stress. Take a hot bath, exercise, or read a book.  Plan something enjoyable to do every day. Reward yourself for not smoking.  Explore interactive web-based programs that specialize in helping you quit. GET MEDICINE AND USE IT CORRECTLY Medicines can help you stop smoking and decrease the urge to smoke. Combining medicine with the above behavioral methods and support can greatly increase your chances of successfully quitting smoking.  Nicotine  replacement therapy helps deliver nicotine to your body without the negative effects and risks of smoking. Nicotine replacement therapy includes nicotine gum, lozenges, inhalers, nasal sprays, and skin patches. Some may be available over-the-counter and others require a prescription.  Antidepressant medicine helps people abstain from smoking, but how this works is unknown. This medicine is available by prescription.  Nicotinic receptor partial agonist medicine simulates the effect of nicotine in your brain. This medicine is available by prescription. Ask your caregiver for advice about which medicines to use and how to use them based on your health history. Your caregiver will tell you what side effects to look out for if you choose to be on a medicine or therapy. Carefully read the information on the package. Do not use any other product containing nicotine while using a nicotine replacement product.  RELAPSE OR DIFFICULT SITUATIONS Most relapses occur within the first 3 months after quitting. Do not be discouraged if you start smoking again. Remember, most people try several times before finally  quitting. You may have symptoms of withdrawal because your body is used to nicotine. You may crave cigarettes, be irritable, feel very hungry, cough often, get headaches, or have difficulty concentrating. The withdrawal symptoms are only temporary. They are strongest when you first quit, but they will go away within 10 14 days. To reduce the chances of relapse, try to:  Avoid drinking alcohol. Drinking lowers your chances of successfully quitting.  Reduce the amount of caffeine you consume. Once you quit smoking, the amount of caffeine in your body increases and can give you symptoms, such as a rapid heartbeat, sweating, and anxiety.  Avoid smokers because they can make you want to smoke.  Do not let weight gain distract you. Many smokers will gain weight when they quit, usually less than 10 pounds. Eat a  healthy diet and stay active. You can always lose the weight gained after you quit.  Find ways to improve your mood other than smoking. FOR MORE INFORMATION  www.smokefree.gov  Document Released: 02/18/2001 Document Revised: 08/26/2011 Document Reviewed: 06/05/2011 [facility] Patient Information 2014 [place], [place].

## 2013-07-29 LAB — COMPLETE METABOLIC PANEL WITH GFR
ALBUMIN: 4.4 g/dL (ref 3.5–5.2)
ALT: 23 U/L (ref 0–53)
AST: 20 U/L (ref 0–37)
Alkaline Phosphatase: 67 U/L (ref 39–117)
BUN: 10 mg/dL (ref 6–23)
CALCIUM: 9.7 mg/dL (ref 8.4–10.5)
CHLORIDE: 104 meq/L (ref 96–112)
CO2: 26 meq/L (ref 19–32)
CREATININE: 0.94 mg/dL (ref 0.50–1.35)
GLUCOSE: 63 mg/dL — AB (ref 70–99)
POTASSIUM: 4.4 meq/L (ref 3.5–5.3)
Sodium: 138 mEq/L (ref 135–145)
Total Bilirubin: 0.4 mg/dL (ref 0.2–1.2)
Total Protein: 7.1 g/dL (ref 6.0–8.3)

## 2013-07-29 LAB — CBC WITH DIFFERENTIAL/PLATELET
Basophils Absolute: 0.1 10*3/uL (ref 0.0–0.1)
Basophils Relative: 1 % (ref 0–1)
Eosinophils Absolute: 0.8 10*3/uL — ABNORMAL HIGH (ref 0.0–0.7)
Eosinophils Relative: 11 % — ABNORMAL HIGH (ref 0–5)
HEMATOCRIT: 43.9 % (ref 39.0–52.0)
Hemoglobin: 15.4 g/dL (ref 13.0–17.0)
LYMPHS ABS: 2.3 10*3/uL (ref 0.7–4.0)
LYMPHS PCT: 32 % (ref 12–46)
MCH: 32 pg (ref 26.0–34.0)
MCHC: 35.1 g/dL (ref 30.0–36.0)
MCV: 91.1 fL (ref 78.0–100.0)
MONO ABS: 0.4 10*3/uL (ref 0.1–1.0)
MONOS PCT: 6 % (ref 3–12)
Neutro Abs: 3.6 10*3/uL (ref 1.7–7.7)
Neutrophils Relative %: 50 % (ref 43–77)
Platelets: 267 10*3/uL (ref 150–400)
RBC: 4.82 MIL/uL (ref 4.22–5.81)
RDW: 12.9 % (ref 11.5–15.5)
WBC: 7.1 10*3/uL (ref 4.0–10.5)

## 2013-07-29 LAB — FERRITIN: Ferritin: 162 ng/mL (ref 22–322)

## 2013-07-29 LAB — LIPASE: Lipase: 39 U/L (ref 0–75)

## 2013-07-29 LAB — AMYLASE: AMYLASE: 38 U/L (ref 0–105)

## 2013-08-03 ENCOUNTER — Encounter: Payer: Self-pay | Admitting: *Deleted

## 2013-08-04 ENCOUNTER — Ambulatory Visit (INDEPENDENT_AMBULATORY_CARE_PROVIDER_SITE_OTHER): Payer: BC Managed Care – PPO | Admitting: Family Medicine

## 2013-08-04 ENCOUNTER — Encounter: Payer: Self-pay | Admitting: Family Medicine

## 2013-08-04 VITALS — BP 121/81 | HR 73 | Temp 98.1°F | Wt 218.0 lb

## 2013-08-04 DIAGNOSIS — K625 Hemorrhage of anus and rectum: Secondary | ICD-10-CM

## 2013-08-04 DIAGNOSIS — R1012 Left upper quadrant pain: Secondary | ICD-10-CM

## 2013-08-04 MED ORDER — METOCLOPRAMIDE HCL 5 MG PO TABS: 5.0000 mg | ORAL_TABLET | Freq: Four times a day (QID) | ORAL | Status: DC | PRN

## 2013-08-04 NOTE — Progress Notes (Signed)
CC: Ronald Hester is a 43 y.o. male is here for left side pain   Subjective: HPI:  Complains of left upper quadrant pain is described as pulsatile, has been present for matter of months. Severe in severity. Radiates into the back. Worse after eating or when crouching forward or to the left. Improves with stretching his back and extension with lifting his arms above his head. Nothing else seems to make better or worse. No benefit from amitriptyline or hyoscyamine, symptoms are slightly improved with smoking marijuana, no other known medical interventions.  Has been accompanied with regurgitating a foamy substance yesterday but no difficulty with vomiting or nausea today. He does have intermittent nausea that comes and goes since this pain started. He also tells me that it's not uncommon to have rectal bleeding over the past 2 years. He tells me that this happens most days during the week, described as bright red and chocolate chip appearance occasionally melanotic appearance.  He had a colonoscopy performed 2 years ago I do not have records for details of this was for abdominal pain and bleeding was not present prior to the colonoscopy. Currently does not have a GI physician.    Review of systems is positive for fatigue.  Positive for bloating occurring only after ingestion of fructose containing items.  Review Of Systems Outlined In HPI  Past Medical History  Diagnosis Date  . IBS (irritable bowel syndrome)     Past Surgical History  Procedure Laterality Date  . Cholecystectomy     No family history on file.  History   Social History  . Marital Status: Married    Spouse Name: N/A    Number of Children: N/A  . Years of Education: N/A   Occupational History  . Not on file.   Social History Main Topics  . Smoking status: Current Every Day Smoker -- 1.00 packs/day for 30 years    Types: Cigarettes  . Smokeless tobacco: Current User     Comment: vape  . Alcohol Use: No  . Drug  Use: Yes    Special: Marijuana  . Sexual Activity: Not on file   Other Topics Concern  . Not on file   Social History Narrative  . No narrative on file     Objective: BP 121/81  Pulse 73  Temp(Src) 98.1 F (36.7 C) (Oral)  Wt 218 lb (98.884 kg)  General: Alert and Oriented, No Acute Distress HEENT: Pupils equal, round, reactive to light. Conjunctivae clear.  Moist membranes pharynx unremarkable Lungs: Clear to auscultation bilaterally, no wheezing/ronchi/rales.  Comfortable work of breathing. Good air movement. Cardiac: Regular rate and rhythm. Normal S1/S2.  No murmurs, rubs, nor gallops.   Abdomen: Normal bowel sounds, soft, no rebound, no guarding. I do believe that I can feel the inferior aspect of the spleen in the left upper quadrant about 3 fingerbreadths below his medial rib cage, with palpation of this mass his pain is reproduced. No pain in any other quadrant. Extremities: No peripheral edema.  Strong peripheral pulses.  Mental Status: No depression, anxiety, nor agitation. Skin: Warm and dry.  Assessment & Plan: Ronald Hester was seen today for left side pain.  Diagnoses and associated orders for this visit:  LUQ pain - metoCLOPramide (REGLAN) 5 MG tablet; Take 1 tablet (5 mg total) by mouth every 6 (six) hours as needed (abdominal pain). - US Abdomen Complete; Future  Rectal bleeding - Ambulatory referral to Gastroenterology    Left upper quadrant pain: Since patient  states that symptoms are identical to that which were present when he had blood work done last week there is any need to do blood work at this time. My exam would suggest splenomegaly therefore would like ultrasound of the abdomen, complete. I encouraged him to try Reglan to help with any nausea in hopes that might provide a little bit of relief compared to hyoscamine.  Further plan will be dictated based on results of his ultrasound Rectal bleeding: All encouraged him to reestablish with a  gastroenterologist. He's quite hesitant about this citing that he's never made progress with gastroenterology in the past. It took quite a bit of time discussing the logic behind the necessity of another colonoscopy since rectal bleeding is a new problem since his last colonoscopy.  40 minutes spent face-to-face during visit today of which at least 50% was counseling or coordinating care regarding: 1. LUQ pain   2. Rectal bleeding       Return if symptoms worsen or fail to improve.

## 2013-08-05 ENCOUNTER — Ambulatory Visit (HOSPITAL_BASED_OUTPATIENT_CLINIC_OR_DEPARTMENT_OTHER)
Admission: RE | Admit: 2013-08-05 | Discharge: 2013-08-05 | Disposition: A | Discharge status: Home / Self Care | Payer: BC Managed Care – PPO | Source: Ambulatory Visit | Attending: Family Medicine | Admitting: Family Medicine

## 2013-08-05 DIAGNOSIS — R1012 Left upper quadrant pain: Secondary | ICD-10-CM

## 2013-08-05 DIAGNOSIS — R112 Nausea with vomiting, unspecified: Secondary | ICD-10-CM | POA: Insufficient documentation

## 2013-08-08 ENCOUNTER — Ambulatory Visit: Payer: BC Managed Care – PPO | Admitting: Family Medicine

## 2013-08-22 ENCOUNTER — Encounter: Payer: Self-pay | Admitting: Family Medicine

## 2013-08-23 ENCOUNTER — Telehealth: Payer: Self-pay | Admitting: Family Medicine

## 2013-08-23 DIAGNOSIS — R1012 Left upper quadrant pain: Secondary | ICD-10-CM

## 2013-08-23 NOTE — Telephone Encounter (Signed)
Pt informed of recommendations and is ok with moving forward with CT of the chest. .Heath GoldBarkley, Tonya Lynetta

## 2013-08-23 NOTE — Telephone Encounter (Signed)
Received a phone call from Dr. Loreta AveMann, his gastroenterologist. She felt like his left upper quadrant pain was really coming from the chest cavity versus the abdominal cavity. She recommended possibly increasing his NSAIDs, starting or increasing a proton pump inhibitor and possibly getting a chest CT for further evaluation. Please call patient related this information. I would like to move forward with a CT of the chest if he is okay with this.  Just let me know.  Also have him increase his omeprazole to twice a day and less Dr. Loreta AveMann directed something else. Also recommend starting Aleve one tab twice a day with food and water. This will help with inflammation and pain, especially if there is some pleurisy or inflammation at the bottom of the lung.

## 2013-08-23 NOTE — Telephone Encounter (Signed)
CT ordered. 

## 2013-08-24 ENCOUNTER — Telehealth: Payer: Self-pay | Admitting: *Deleted

## 2013-08-24 ENCOUNTER — Other Ambulatory Visit: Payer: Self-pay | Admitting: *Deleted

## 2013-08-24 MED ORDER — OMEPRAZOLE 40 MG PO CPDR: DELAYED_RELEASE_CAPSULE | ORAL | Status: AC

## 2013-08-24 NOTE — Telephone Encounter (Signed)
PA obtained for CT Chest w/o. Auth # [id number]. Exp. 09/22/13.  Meyer CoryMisty Ahmad, LPN

## 2014-03-07 ENCOUNTER — Telehealth: Payer: Self-pay | Admitting: *Deleted

## 2014-03-07 ENCOUNTER — Encounter: Payer: Self-pay | Admitting: Family Medicine

## 2014-03-07 ENCOUNTER — Ambulatory Visit (INDEPENDENT_AMBULATORY_CARE_PROVIDER_SITE_OTHER): Payer: BC Managed Care – PPO | Admitting: Family Medicine

## 2014-03-07 VITALS — BP 130/75 | HR 74 | Ht 71.0 in | Wt 212.0 lb

## 2014-03-07 DIAGNOSIS — R109 Unspecified abdominal pain: Secondary | ICD-10-CM | POA: Diagnosis not present

## 2014-03-07 DIAGNOSIS — R10819 Abdominal tenderness, unspecified site: Secondary | ICD-10-CM

## 2014-03-07 DIAGNOSIS — G894 Chronic pain syndrome: Secondary | ICD-10-CM

## 2014-03-07 DIAGNOSIS — K589 Irritable bowel syndrome without diarrhea: Secondary | ICD-10-CM | POA: Diagnosis not present

## 2014-03-07 DIAGNOSIS — R1011 Right upper quadrant pain: Secondary | ICD-10-CM

## 2014-03-07 MED ORDER — TRAMADOL HCL 50 MG PO TABS: 50.0000 mg | ORAL_TABLET | Freq: Three times a day (TID) | ORAL | Status: DC | PRN

## 2014-03-07 MED ORDER — DEXLANSOPRAZOLE 60 MG PO CPDR: 60.0000 mg | DELAYED_RELEASE_CAPSULE | Freq: Every day | ORAL | Status: AC

## 2014-03-07 NOTE — Telephone Encounter (Signed)
CT prior auth [id number]. Radiology notified.

## 2014-03-07 NOTE — Progress Notes (Signed)
Subjective:    Patient ID: Ronald Hester, male    DOB: 10-14-1970, 43 y.o.   MRN: [id number]  [facility]  for low Back pain x 6 months and chest pain.  Pain starts in low back and shoots up and causes his chest to hurt.  Feels muscle are weak in his arms. More so in his right arm. Has pain in the right axilla as well. .  Feels like a hot stabbing sensation. Rates pain 8/10. Given oxycodone but caused nausea and vomiting. Also given muscle relaxer adn prednisone and amitryptiline. CT of neck was normal.  CXR was normal. Cardiac enyzymes are normal.  Says the muscle relaxer didn't really help and just made him sleepy and hard to pee. He really wants something to control his pain while he is being evaluated and undergoing workup. Unfortunately he is intolerant to codeine, hydrocodone, and oxycodone. He has actually tried a buprenorphine patch. He has tried a 10 and 20 mg patch and says that it does seem to help his pain. He wants to know if I would be willing to prescribe this today.  Went to ED back on May for similar sxs.  Had a chest CT done that showed abnormalities and has seen pulmonology. They will follow him for 2 years. They felt he didn't have pleurisy and felt changes were not related to his pain.   Pain is now focused in his midback.  Gets worse as the day goes on. Wakes him up at night.  Says sharp pain stars on the right mid back and radiates inbto his RUQ. Feels like right arm goes weak at the same time.  Will The sharp pain will last 30 minutes and then will have constant dull ache on the right side.  Gets nauseated and then vomits about 1-2 times per weeks. Hx of cholecystectomy.  Last week the pain hit after drinking a cup of coffee.  No blood in stool.    IBS has been better since elimated fructose, corn, and lactose.   Review of Systems     Objective:   Physical Exam  Constitutional: He is oriented to person, place, and time. He appears well-developed  and well-nourished.  HENT:  Head: Normocephalic and atraumatic.  Cardiovascular: Normal rate, regular rhythm and normal heart sounds.   Pulmonary/Chest: Effort normal and breath sounds normal.    Abdominal: Soft. Bowel sounds are normal. He exhibits no distension and no mass. There is tenderness. There is no rebound and no guarding.    TTP in the LLQ and RLQ and mildly in the left lateral upper quad.  No epigastric pain or tenderness.    Musculoskeletal:  nontender over the thoracic spine.  nontender over the ribs.  Back with NROM.    Neurological: He is alert and oriented to person, place, and time.  Skin: Skin is warm and dry.  Psychiatric: He has a normal mood and affect. His behavior is normal.          Assessment & Plan:  Right flank pain - kidney stone versus cholangitis versus peptic ulcer. I am concerned that the pain is sharp and severe and radiates to his abdomen. Unable to re-create his pain with palpation. I really don't think this is musculoskeletal in nature. Recommend CT of the abdomen for further evaluation. He artery sees GI. I'm going to discontinue his omeprazole and start him on the Dexilant instead. Avoid things like caffeine that can irritate  the stomach. He really wanted me to prescribe the buprenorphine patch. I explained that I don't Rx this medication  I would be happy to refer him to a pain clinic.  He had done ok with tramadol in the past so will Rx this today. He says he smokes marijuana daily and was told that this was okay by one of his other providers to help with his GI symptoms. He's worried that they will not accept him as a patient for chronic pain management because of this. I encouraged him to at least get a consult.  Abdominal tenderness-unclear etiology at this point. May want to consider follow back up with GI. Mostly in areas where there is bowel. Could be related to his IBS.  Irritable bowel syndrome-seems to be much improved with dietary changes  as noted above.

## 2014-03-07 NOTE — Assessment & Plan Note (Signed)
Controlled with elimination of fructose, corn and lactose from diet.

## 2014-03-07 NOTE — Addendum Note (Signed)
Addended by: Nani GasserMETHENEY, Yazmyne Sara D on: 03/07/2014 05:21 PM   Modules accepted: Orders

## 2014-03-08 ENCOUNTER — Ambulatory Visit (INDEPENDENT_AMBULATORY_CARE_PROVIDER_SITE_OTHER): Payer: BC Managed Care – PPO

## 2014-03-08 DIAGNOSIS — R109 Unspecified abdominal pain: Secondary | ICD-10-CM

## 2014-03-08 DIAGNOSIS — Z9049 Acquired absence of other specified parts of digestive tract: Secondary | ICD-10-CM

## 2014-03-08 DIAGNOSIS — R1084 Generalized abdominal pain: Secondary | ICD-10-CM

## 2014-03-08 DIAGNOSIS — R110 Nausea: Secondary | ICD-10-CM

## 2014-03-08 DIAGNOSIS — R1011 Right upper quadrant pain: Secondary | ICD-10-CM

## 2014-03-08 MED ORDER — IOHEXOL 300 MG/ML  SOLN
100.0000 mL | Freq: Once | INTRAMUSCULAR | Status: AC | PRN
Start: 2014-03-08 — End: 2014-03-08
  Administered 2014-03-08: 100 mL via INTRAVENOUS

## 2015-02-12 ENCOUNTER — Emergency Department (INDEPENDENT_AMBULATORY_CARE_PROVIDER_SITE_OTHER): Payer: BLUE CROSS/BLUE SHIELD

## 2015-02-12 ENCOUNTER — Encounter: Payer: Self-pay | Admitting: *Deleted

## 2015-02-12 ENCOUNTER — Emergency Department
Admission: EM | Admit: 2015-02-12 | Discharge: 2015-02-12 | Disposition: A | Discharge status: Home / Self Care | Payer: BLUE CROSS/BLUE SHIELD | Source: Home / Self Care | Attending: Family Medicine | Admitting: Family Medicine

## 2015-02-12 DIAGNOSIS — R062 Wheezing: Secondary | ICD-10-CM | POA: Diagnosis not present

## 2015-02-12 DIAGNOSIS — R0602 Shortness of breath: Secondary | ICD-10-CM | POA: Diagnosis not present

## 2015-02-12 DIAGNOSIS — J069 Acute upper respiratory infection, unspecified: Secondary | ICD-10-CM

## 2015-02-12 MED ORDER — AZITHROMYCIN 250 MG PO TABS: 250.0000 mg | ORAL_TABLET | Freq: Every day | ORAL | Status: AC

## 2015-02-12 MED ORDER — BENZONATATE 100 MG PO CAPS: 100.0000 mg | ORAL_CAPSULE | Freq: Three times a day (TID) | ORAL | Status: AC

## 2015-02-12 MED ORDER — IPRATROPIUM-ALBUTEROL 0.5-2.5 (3) MG/3ML IN SOLN
3.0000 mL | Freq: Once | RESPIRATORY_TRACT | Status: AC
  Administered 2015-02-12: 3 mL via RESPIRATORY_TRACT

## 2015-02-12 MED ORDER — ALBUTEROL SULFATE HFA 108 (90 BASE) MCG/ACT IN AERS: 1.0000 | INHALATION_SPRAY | Freq: Four times a day (QID) | RESPIRATORY_TRACT | Status: AC | PRN

## 2015-02-12 MED ORDER — PREDNISONE 20 MG PO TABS
ORAL_TABLET | ORAL | Status: AC
Start: 2015-02-12 — End: ?

## 2015-02-12 NOTE — ED Provider Notes (Signed)
CSN: [id number]     Arrival date & time 02/12/15  1236 History   First MD Initiated Contact with Patient 02/12/15 1300     Chief Complaint  Patient presents with  . Cough  . Shortness of Breath   (Consider location/radiation/quality/duration/timing/severity/associated sxs/prior Treatment) HPI Pt is a 44yo male presenting to [facility] with c/o URI symptoms for 1 week. Pt states he felt like he was getting better but then 2 days ago he developed a wheeze and bilateral lower chest pain that is intermittent, sharp in nature, worse with deep breathing and cough. 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  He also reports he was dx "the start of emphysema"  He has not tried any OTC medications. He does not have an inhaler at this time but he has used on in the past as well as prednisone for bronchitis.  Denies recent travel or sick contacts.   Past Medical History  Diagnosis Date  . IBS (irritable bowel syndrome)    Past Surgical History  Procedure Laterality Date  . Cholecystectomy     History reviewed. No pertinent family history. Social History  Substance Use Topics  . Smoking status: Current Every Day Smoker -- 1.00 packs/day for 30 years    Types: Cigarettes  . Smokeless tobacco: Current User     Comment: vape  . Alcohol Use: No    Review of Systems  Constitutional: Positive for fever, chills and fatigue.  HENT: Positive for congestion, rhinorrhea and sinus pressure. Negative for ear pain, sore throat, trouble swallowing and voice change.   Respiratory: Positive for cough, shortness of breath and wheezing.   Cardiovascular: Positive for chest pain. Negative for palpitations.  Gastrointestinal: Negative for nausea, vomiting, abdominal pain and diarrhea.  Musculoskeletal: Positive for myalgias and arthralgias. Negative for back pain.  Skin: Negative for rash.  All other systems reviewed and are negative.   Allergies  Codeine; Fructose; Hydrocodone; Lactose intolerance (gi); and  Oxycodone  Home Medications   Prior to Admission medications   Medication Sig Start Date End Date Taking? Authorizing Provider  Buprenorphine (BUTRANS) 15 MCG/HR PTWK Place onto the skin.   Yes Historical Provider, MD  albuterol (PROVENTIL HFA;VENTOLIN HFA) 108 (90 BASE) MCG/ACT inhaler Inhale 1-2 puffs into the lungs every 6 (six) hours as needed for wheezing or shortness of breath. 02/12/15   Junius Finner, PA-C  amitriptyline (ELAVIL) 50 MG tablet Take 0.5-1 tablets (25-50 mg total) by mouth at bedtime. 07/28/13   Agapito Games, MD  azithromycin (ZITHROMAX) 250 MG tablet Take 1 tablet (250 mg total) by mouth daily. Take first 2 tablets together, then 1 every day until finished. 02/12/15   Junius Finner, PA-C  benzonatate (TESSALON) 100 MG capsule Take 1 capsule (100 mg total) by mouth every 8 (eight) hours. 02/12/15   Junius Finner, PA-C  dexlansoprazole (DEXILANT) 60 MG capsule Take 1 capsule (60 mg total) by mouth daily. 03/07/14   Agapito Games, MD  hyoscyamine (LEVBID) 0.375 MG 12 hr tablet Take 1 tablet (0.375 mg total) by mouth 2 (two) times daily. 07/28/13   Agapito Games, MD  methocarbamol (ROBAXIN) 500 MG tablet Take 500 mg by mouth 2 (two) times daily. 02/20/14   Historical Provider, MD  omeprazole (PRILOSEC) 40 MG capsule Take 2 capsules by mouth daily 08/24/13   Agapito Games, MD  predniSONE (DELTASONE) 20 MG tablet 3 tabs po day one, then 2 po daily x 4 days 02/12/15   Junius Finner, PA-C  Meds Ordered and Administered this Visit   Medications  ipratropium-albuterol (DUONEB) 0.5-2.5 (3) MG/3ML nebulizer solution 3 mL (3 mLs Nebulization Given 02/12/15 1332)    BP 103/68 mmHg  Pulse 64  Temp(Src) 98.2 F (36.8 C) (Oral)  Resp 18  Wt 215 lb (97.523 kg)  SpO2 100% No data found.   Physical Exam  Constitutional: He appears well-developed and well-nourished.  HENT:  Head: Normocephalic and atraumatic.  Right Ear: Hearing, tympanic membrane,  external ear and ear canal normal.  Left Ear: Hearing, tympanic membrane, external ear and ear canal normal.  Nose: Mucosal edema present.  Mouth/Throat: Uvula is midline, oropharynx is clear and moist and mucous membranes are normal.  Eyes: Conjunctivae are normal. No scleral icterus.  Neck: Normal range of motion. Neck supple.  Cardiovascular: Normal rate, regular rhythm and normal heart sounds.   Pulmonary/Chest: Effort normal. No respiratory distress. He has wheezes. He has no rales. He exhibits no tenderness.  Abdominal: Soft. Bowel sounds are normal. He exhibits no distension and no mass. There is no tenderness. There is no rebound and no guarding.  Musculoskeletal: Normal range of motion.  Neurological: He is alert.  Skin: Skin is warm and dry.  Nursing note and vitals reviewed.   ED Course  Procedures (including critical care time)  Labs Review Labs Reviewed - No data to display  Imaging Review Dg Chest 2 View  02/12/2015  CLINICAL DATA:  Heaviness of the chest with shortness of breath for 3 days EXAM: CHEST  2 VIEW COMPARISON:  03/06/2009 FINDINGS: Normal heart size and mediastinal contours. No acute infiltrate or edema. No effusion or pneumothorax. No acute osseous findings. IMPRESSION: No active cardiopulmonary disease. Electronically Signed   By: Marnee SpringJonathon  Watts M.D.   On: 02/12/2015 13:24     MDM   1. Acute upper respiratory infection   2. Wheeze    [facility]/o worsening URI symptoms, associated fatigue, wheeze and chest pain.  O2 is 100% on RA  Tx in UC: duoneb tx, wheeze did improve and pt stated he felt mild improvement.  CXR: no active cardipulmonary disease, however, due to worsening URI symptoms with wheeze, and reports of "early emphysema" per pt, will start on Azithromycin to cover atypical bacteria.  Rx: azithromycin, prednisone, tessalon, and inhaler.   Advised pt to use acetaminophen and ibuprofen as needed for fever and pain.  Encouraged rest and fluids. F/u with PCP in 7-10 days if not improving, sooner if worsening. Pt verbalized understanding and agreement with tx plan.   Junius Finnerrin O'Malley, PA-C 02/12/15 1409

## 2015-02-12 NOTE — Discharge Instructions (Signed)
You may take 400-600mg Ibuprofen (Motrin) every 6-8 hours for fever and pain  °Alternate with Tylenol  °You may take 500mg Tylenol every 4-6 hours as needed for fever and pain  °Follow-up with your primary care provider next week for recheck of symptoms if not improving.  °Be sure to drink plenty of fluids and rest, at least 8hrs of sleep a night, preferably more while you are sick. °Return urgent care or go to closest ER if you cannot keep down fluids/signs of dehydration, fever not reducing with Tylenol, difficulty breathing/wheezing, stiff neck, worsening condition, or other concerns (see below)  °Please take antibiotics as prescribed and be sure to complete entire course even if you start to feel better to ensure infection does not come back. ° ° °Cool Mist Vaporizers °Vaporizers may help relieve the symptoms of a cough and cold. They add moisture to the air, which helps mucus to become thinner and less sticky. This makes it easier to breathe and cough up secretions. Cool mist vaporizers do not cause serious burns like hot mist vaporizers, which may also be called steamers or humidifiers. Vaporizers have not been proven to help with colds. You should not use a vaporizer if you are allergic to mold. °HOME CARE INSTRUCTIONS °· Follow the package instructions for the vaporizer. °· Do not use anything other than distilled water in the vaporizer. °· Do not run the vaporizer all of the time. This can cause mold or bacteria to grow in the vaporizer. °· Clean the vaporizer after each time it is used. °· Clean and dry the vaporizer well before storing it. °· Stop using the vaporizer if worsening respiratory symptoms develop. °  °This information is not intended to replace advice given to you by your health care provider. Make sure you discuss any questions you have with your health care provider. °  °Document Released: 11/22/2003 Document Revised: 03/01/2013 Document Reviewed: 07/14/2012 °Elsevier Interactive Patient  Education ©2016 [employer] ° °

## 2015-02-12 NOTE — ED Notes (Signed)
Pt c/o 1 week of URI, cough with SOB. H/o 3 floating ribs. Taken IBF OTC.

## 2015-02-16 ENCOUNTER — Telehealth: Payer: Self-pay

## 2016-02-28 IMAGING — CT CT ABD-PELV W/ CM
2 of 5 series · 13 of 32 positions shown, 18 images · IV contrast (omnipaque)
Comparison: CT 03/21/2009.  Abdominal ultrasound 08/05/2013

CLINICAL DATA: Right and left abdominal pain. Right flank pain.
Nausea.

EXAM:
CT ABDOMEN AND PELVIS WITH CONTRAST
TECHNIQUE: Multidetector CT imaging of the abdomen and pelvis was performed
using the standard protocol following bolus administration of
intravenous contrast.
CONTRAST:  100mL OMNIPAQUE IOHEXOL 300 MG/ML  SOLN

[Series 2: abd/pelvis with · axial · 0.78mm/px · z∈[-521,-201]mm · 5 of 97 slices shown, 10 images]
[im 17/97  soft-tissue]
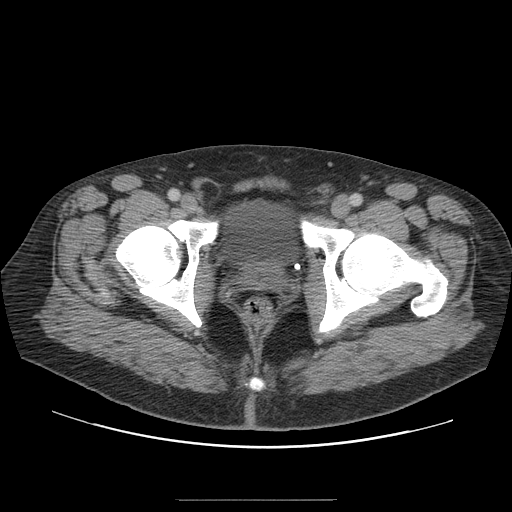
[im 17/97  bone]
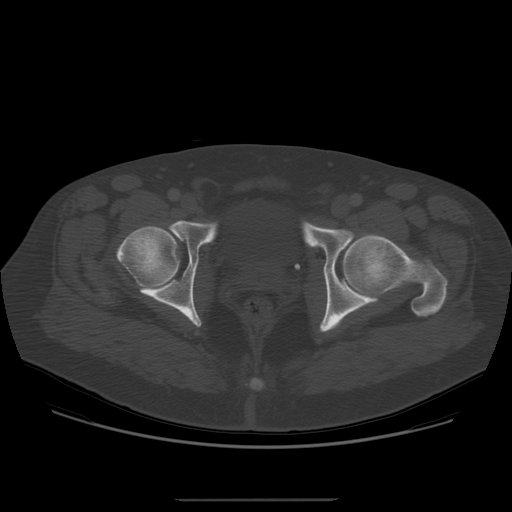
[im 33/97  soft-tissue]
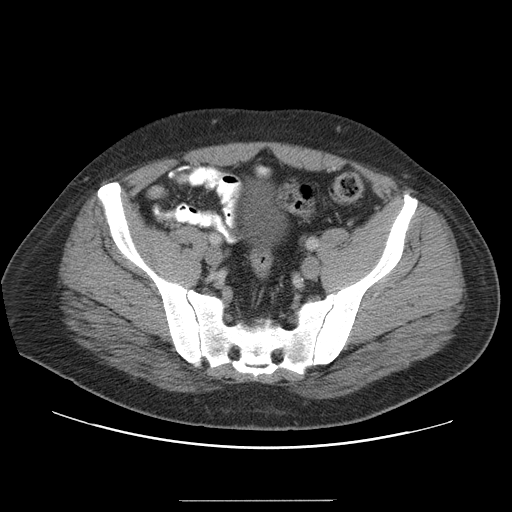
[im 33/97  lung]
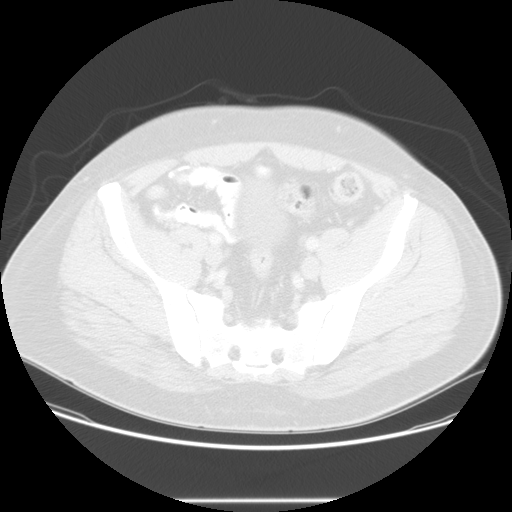
[im 49/97  soft-tissue]
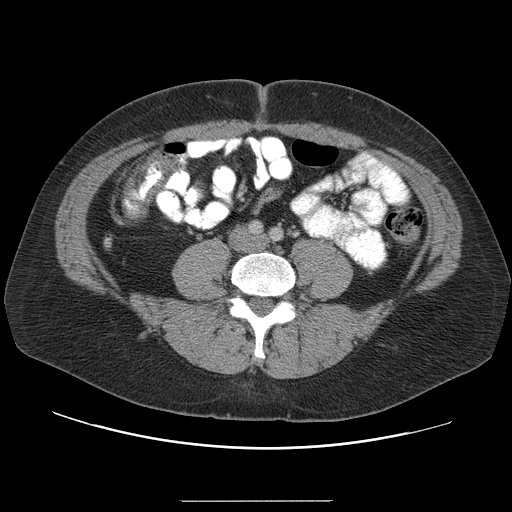
[im 49/97  lung]
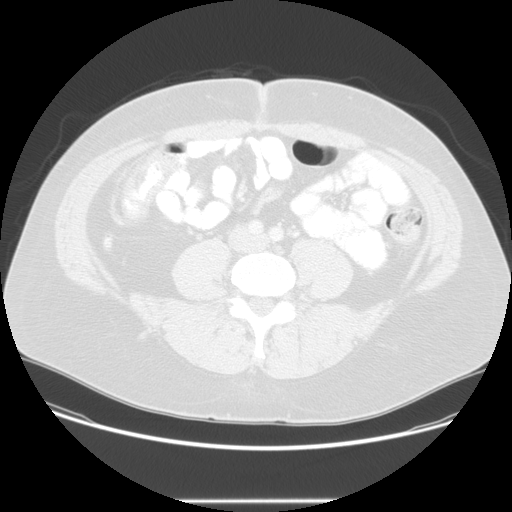
[im 65/97  soft-tissue]
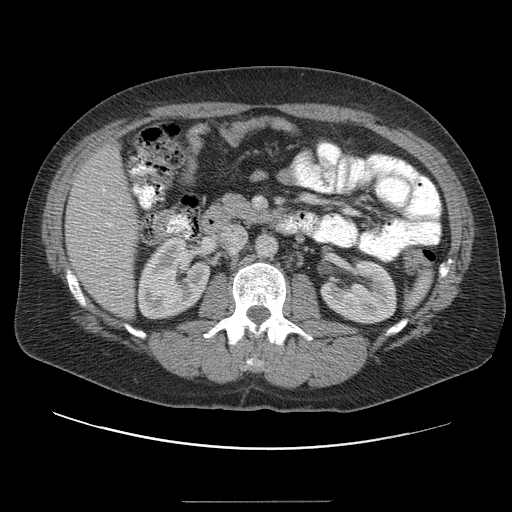
[im 65/97  lung]
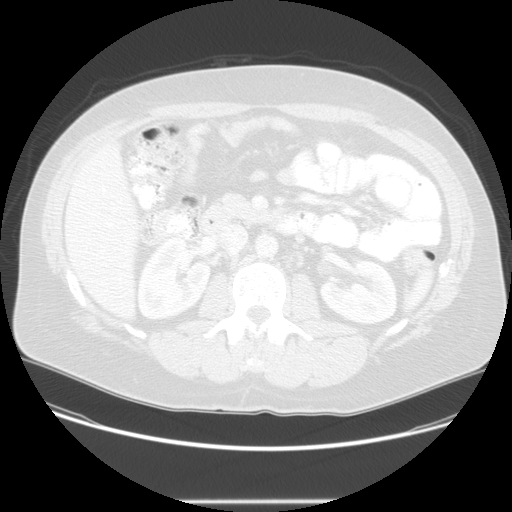
[im 81/97  soft-tissue]
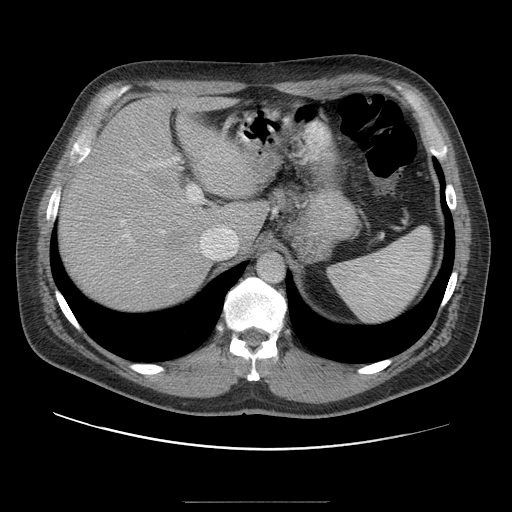
[im 81/97  lung]
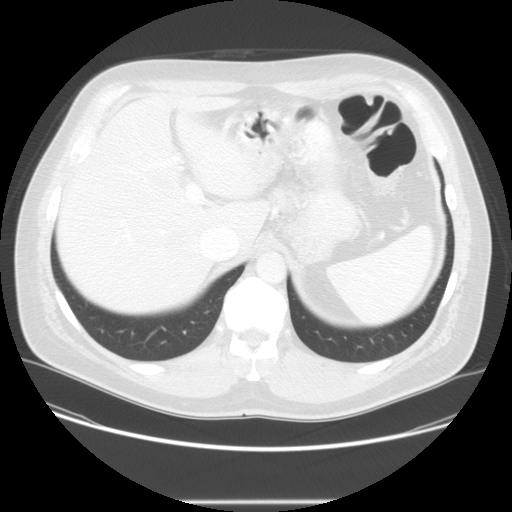

[Series 400: sag · sagittal · 0.96mm/px · 8 of 198 slices shown]
[im 18/198  soft-tissue]
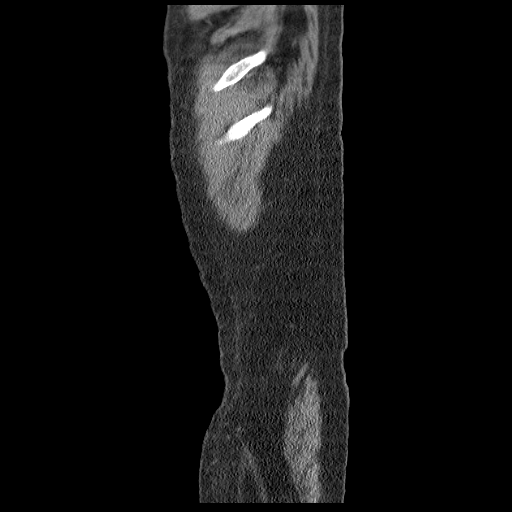
[im 36/198  soft-tissue]
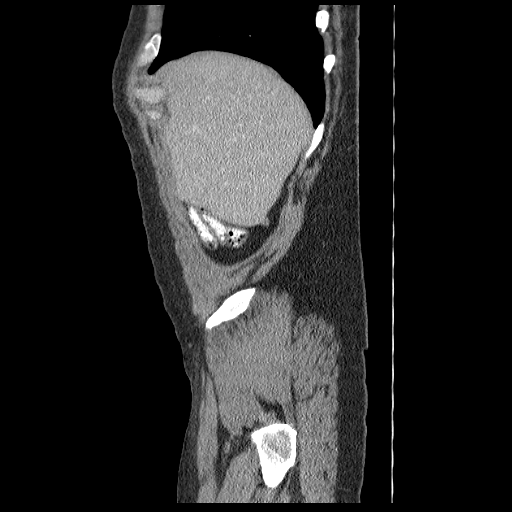
[im 72/198  soft-tissue]
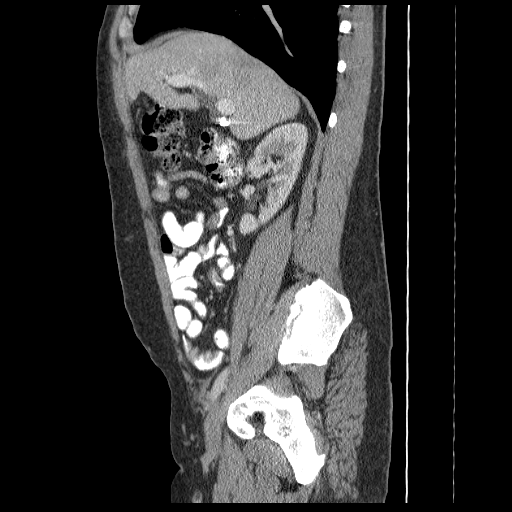
[im 90/198  soft-tissue]
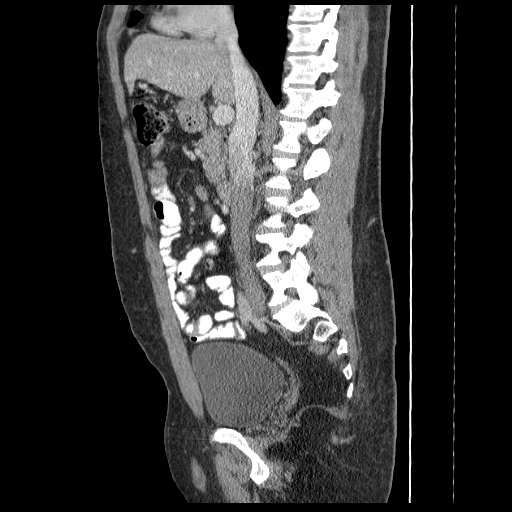
[im 108/198  soft-tissue]
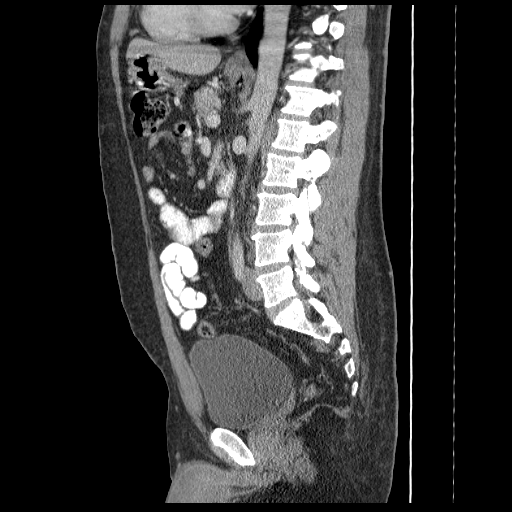
[im 126/198  soft-tissue]
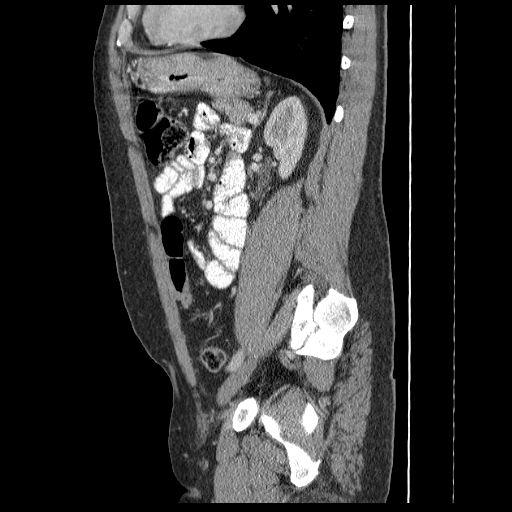
[im 162/198  soft-tissue]
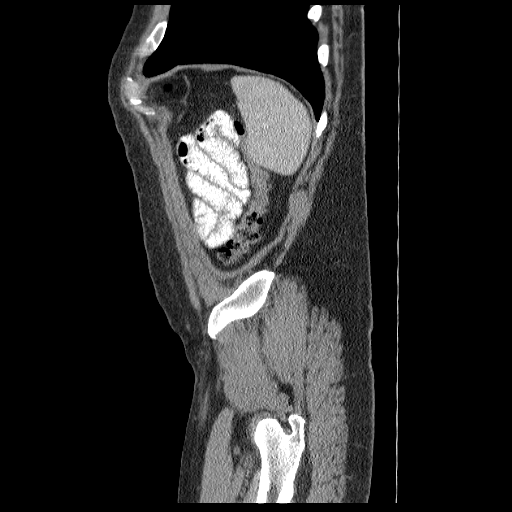
[im 180/198  soft-tissue]
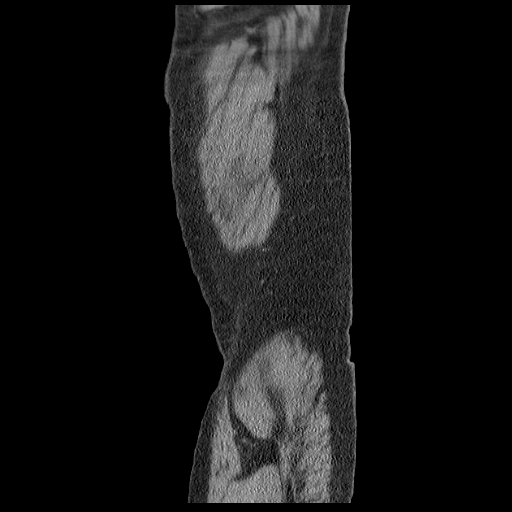

[13 of 32 positions shown; findings below may reference images not displayed]

FINDINGS: Lower chest:  The included lung bases are clear.

Hepatobiliary: Clips in gallbladder fossa from cholecystectomy. No
biliary dilatation. No focal lesion. Liver is normal in density.

Pancreas: Normal. No pancreatic ductal dilatation or peripancreatic
inflammatory change.

Spleen: Normal in size and density.

Adrenals/Urinary Tract: No adrenal nodule. Kidneys are symmetric in
size and enhancement. Symmetric renal excretion. No stone,
hydronephrosis or focal renal abnormality. Urinary bladder is
physiologically distended and normal.

Stomach/Bowel: The stomach is decompressed. There are no dilated or
thickened bowel loops. Small-moderate volume of stool is seen
throughout the colon. The appendix is normal.

Vascular/Lymphatic: Abdominal aorta is normal in caliber. There is
no mesenteric or retroperitoneal adenopathy.

Reproductive: Prostate gland is normal in size.

Other: No ascites. No intra-abdominal fluid collection. Left pelvic
phlebolith noted.

Musculoskeletal: Mild degenerative change in the spine. No discrete
osseous lesion.
IMPRESSION: 1. Post cholecystectomy without biliary dilatation.
2. No acute abnormality.  No findings to explain abdominal pain.

## 2017-02-03 IMAGING — CR DG CHEST 2V
2 series · 2 of 2 positions shown · non-contrast
Comparison: 03/06/2009

CLINICAL DATA: Heaviness of the chest with shortness of breath for
3 days

EXAM:
CHEST  2 VIEW

[chest pa]
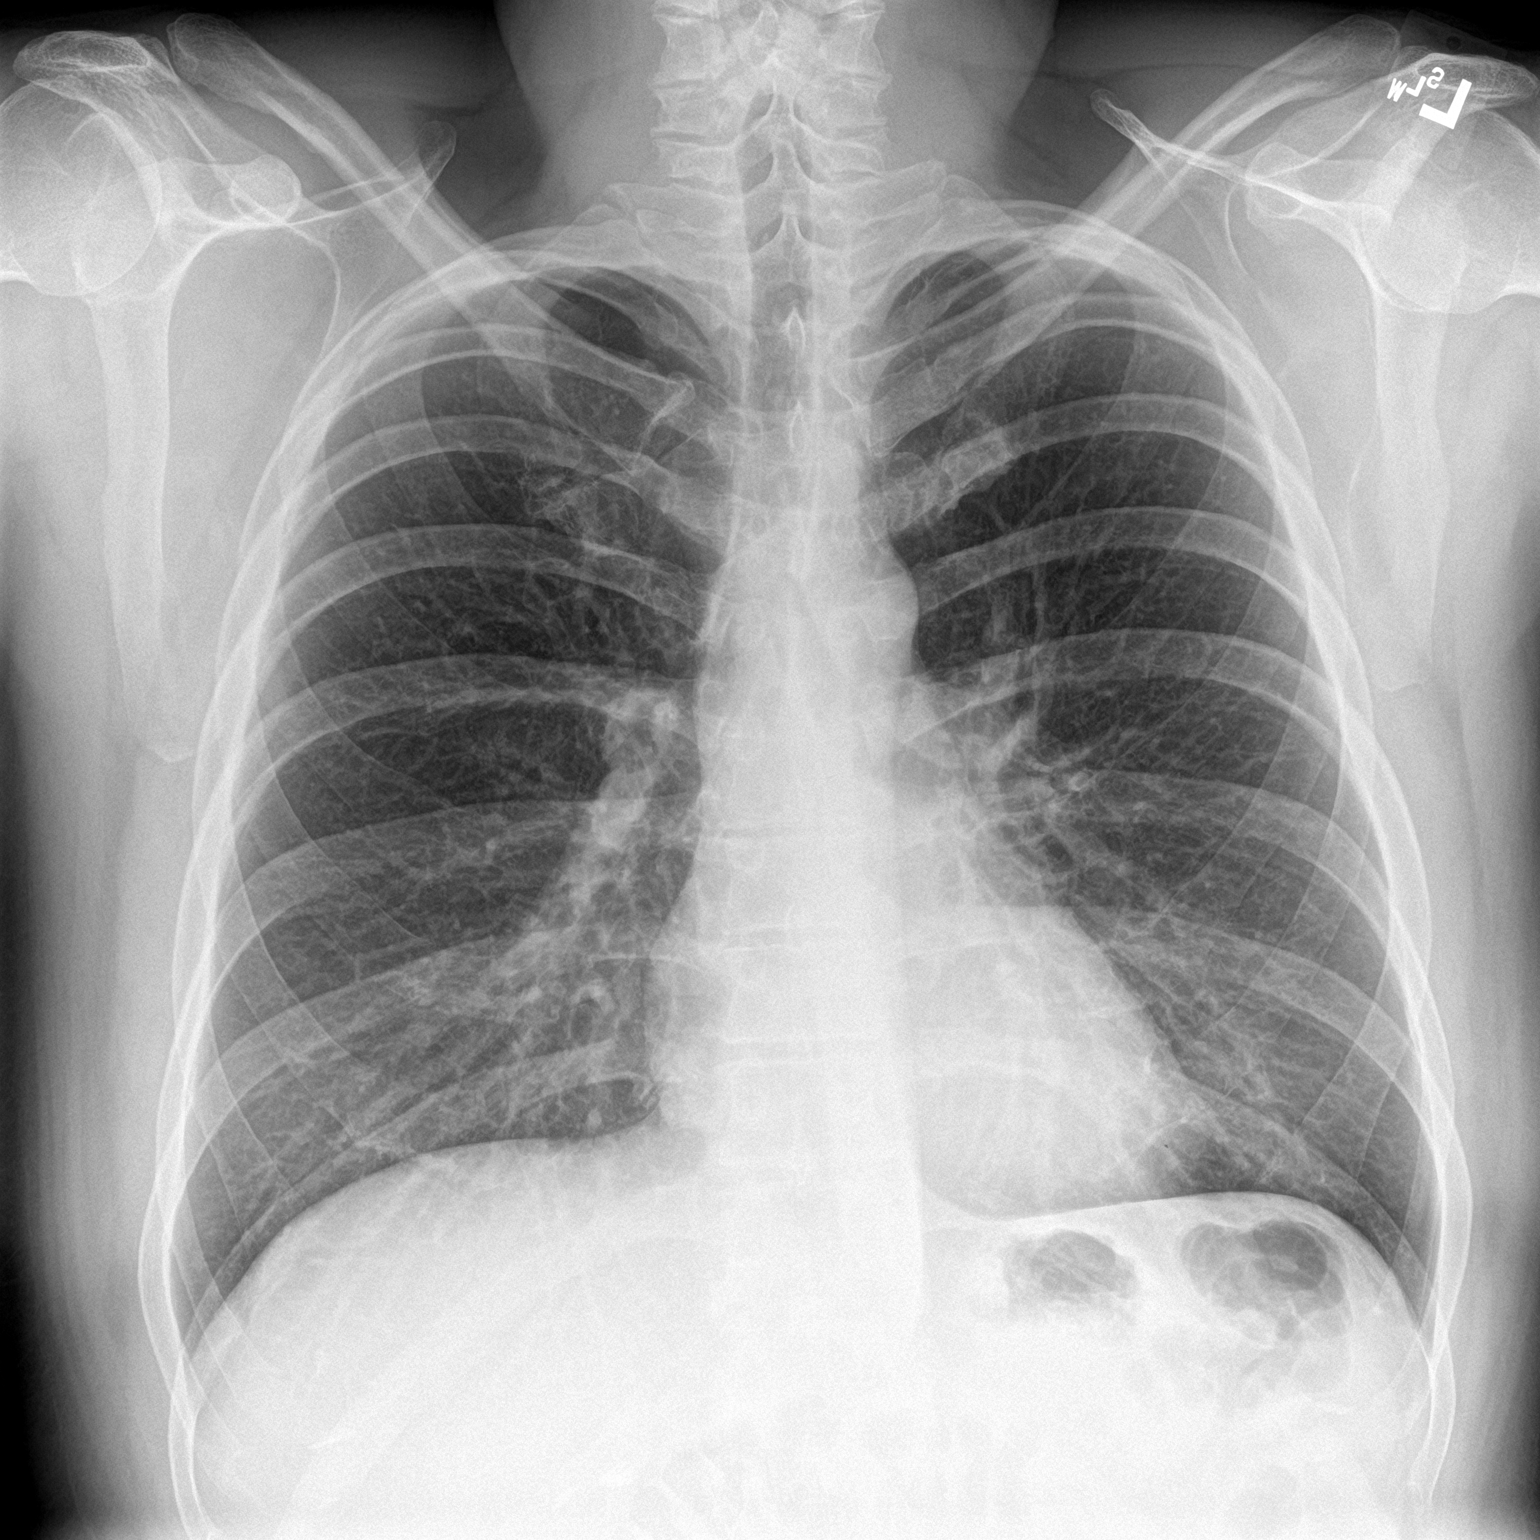

[chest lat]
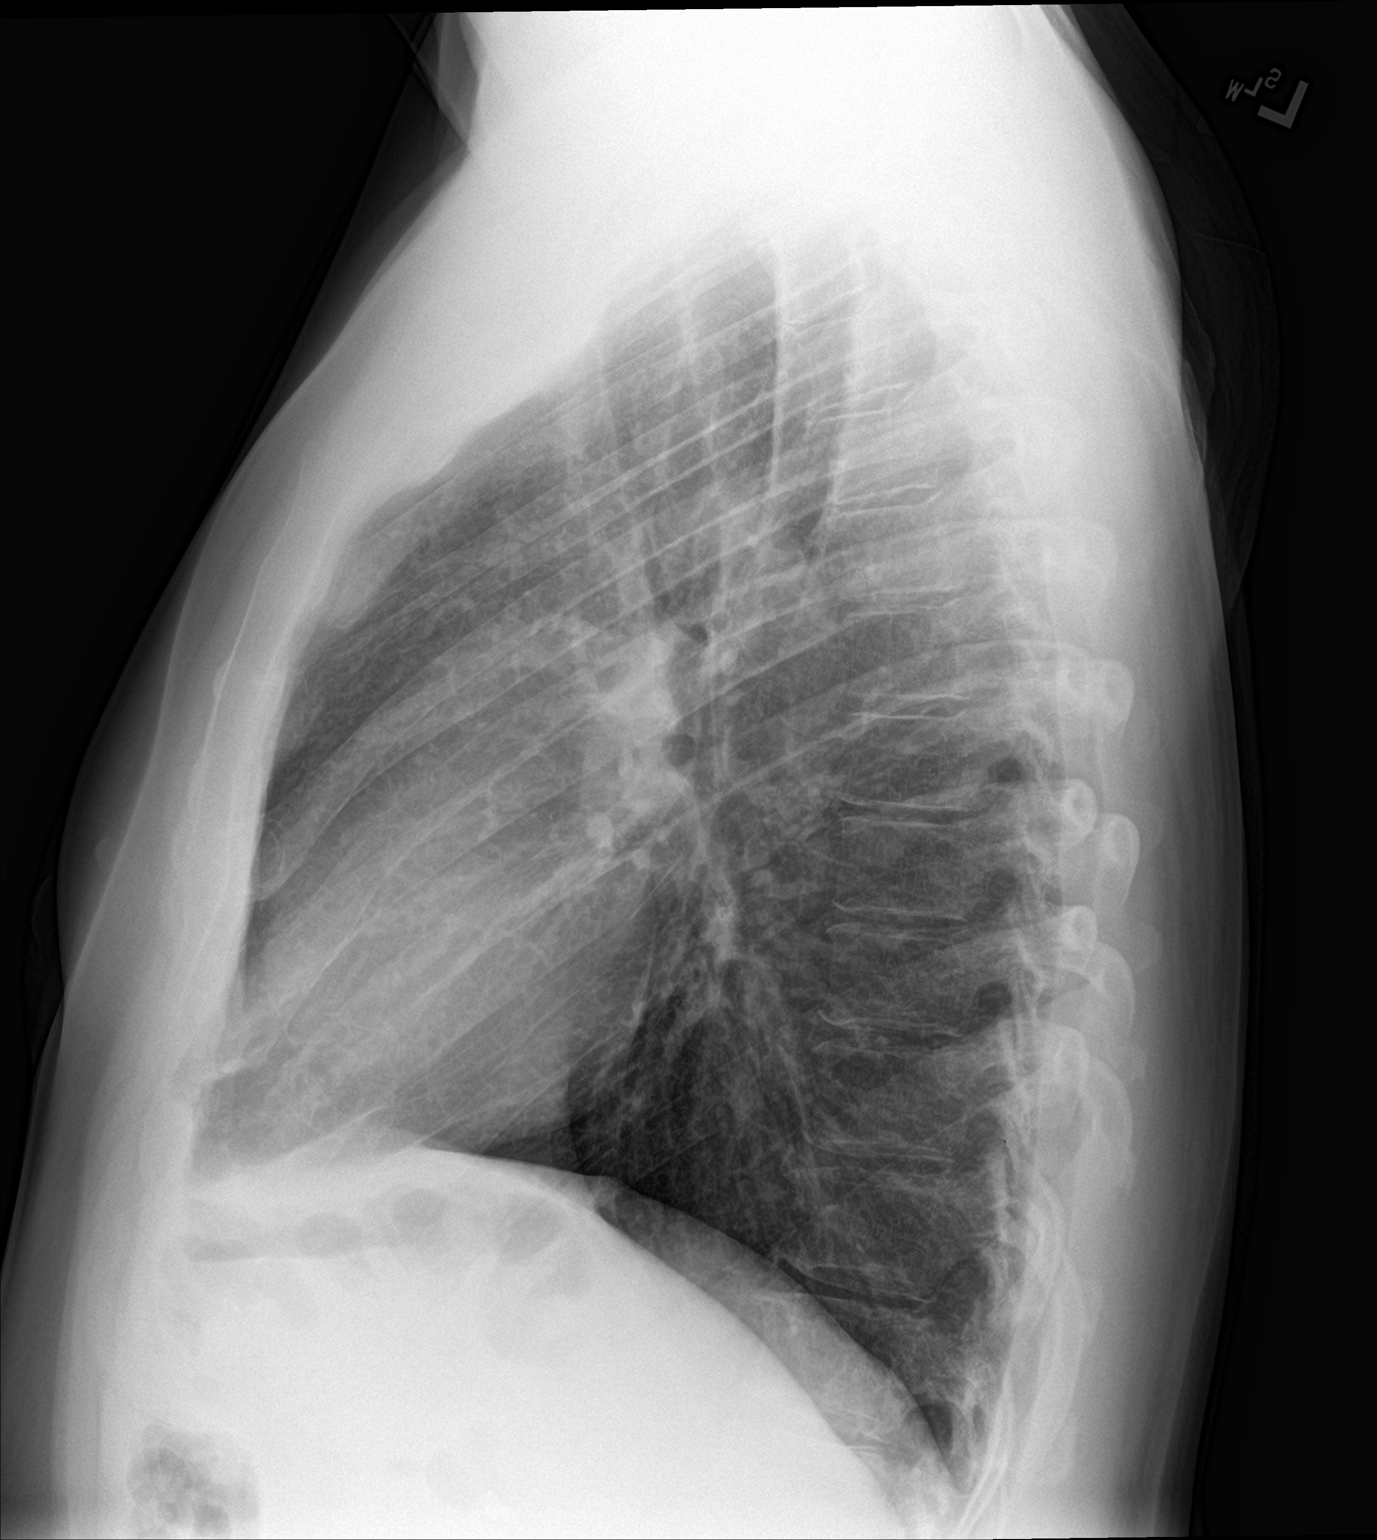

[2 of 2 positions shown; findings below may reference images not displayed]

FINDINGS: Normal heart size and mediastinal contours. No acute infiltrate or
edema. No effusion or pneumothorax. No acute osseous findings.
IMPRESSION: No active cardiopulmonary disease.
